# Patient Record
Sex: Male | Born: 1970
Health system: Southern US, Community
[De-identification: ages and names within clinical notes are randomized; demographics above are authoritative.]

## PROBLEM LIST (undated history)

## (undated) DIAGNOSIS — K219 Gastro-esophageal reflux disease without esophagitis: Secondary | ICD-10-CM

## (undated) DIAGNOSIS — J302 Other seasonal allergic rhinitis: Secondary | ICD-10-CM

## (undated) DIAGNOSIS — K5792 Diverticulitis of intestine, part unspecified, without perforation or abscess without bleeding: Secondary | ICD-10-CM

## (undated) DIAGNOSIS — R740 Nonspecific elevation of levels of transaminase and lactic acid dehydrogenase [LDH]: Secondary | ICD-10-CM

## (undated) DIAGNOSIS — T7840XA Allergy, unspecified, initial encounter: Secondary | ICD-10-CM

## (undated) DIAGNOSIS — R7401 Elevation of levels of liver transaminase levels: Secondary | ICD-10-CM

## (undated) DIAGNOSIS — E785 Hyperlipidemia, unspecified: Secondary | ICD-10-CM

## (undated) DIAGNOSIS — I700 Atherosclerosis of aorta: Secondary | ICD-10-CM

## (undated) DIAGNOSIS — R932 Abnormal findings on diagnostic imaging of liver and biliary tract: Secondary | ICD-10-CM

## (undated) HISTORY — DX: Nonspecific elevation of levels of transaminase and lactic acid dehydrogenase (ldh): R74.0

## (undated) HISTORY — DX: Elevation of levels of liver transaminase levels: R74.01

## (undated) HISTORY — PX: APPENDECTOMY: SHX54

## (undated) HISTORY — DX: Other seasonal allergic rhinitis: J30.2

## (undated) HISTORY — DX: Hyperlipidemia, unspecified: E78.5

## (undated) HISTORY — DX: Gastro-esophageal reflux disease without esophagitis: K21.9

## (undated) HISTORY — DX: Diverticulitis of intestine, part unspecified, without perforation or abscess without bleeding: K57.92

## (undated) HISTORY — DX: Abnormal findings on diagnostic imaging of liver and biliary tract: R93.2

## (undated) HISTORY — PX: INGUINAL HERNIA REPAIR: SUR1180

## (undated) HISTORY — DX: Atherosclerosis of aorta: I70.0

## (undated) HISTORY — DX: Allergy, unspecified, initial encounter: T78.40XA

---
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
## 2006-03-07 ENCOUNTER — Ambulatory Visit: Payer: Self-pay | Admitting: Family Medicine

## 2006-05-10 HISTORY — PX: INGUINAL HERNIA REPAIR: SUR1180

## 2006-06-01 ENCOUNTER — Ambulatory Visit: Payer: Self-pay | Admitting: Family Medicine

## 2006-12-08 ENCOUNTER — Ambulatory Visit: Payer: Self-pay | Admitting: Family Medicine

## 2007-02-07 ENCOUNTER — Encounter (INDEPENDENT_AMBULATORY_CARE_PROVIDER_SITE_OTHER): Payer: Self-pay | Admitting: General Surgery

## 2007-02-07 ENCOUNTER — Ambulatory Visit (HOSPITAL_BASED_OUTPATIENT_CLINIC_OR_DEPARTMENT_OTHER): Admission: RE | Admit: 2007-02-07 | Discharge: 2007-02-07 | Payer: Self-pay | Admitting: General Surgery

## 2007-06-06 ENCOUNTER — Ambulatory Visit: Payer: Self-pay | Admitting: Family Medicine

## 2007-07-25 ENCOUNTER — Ambulatory Visit: Payer: Self-pay | Admitting: Family Medicine

## 2008-09-25 ENCOUNTER — Emergency Department (HOSPITAL_COMMUNITY): Admission: EM | Admit: 2008-09-25 | Discharge: 2008-09-25 | Payer: Self-pay | Admitting: *Deleted

## 2008-09-25 IMAGING — CR DG CHEST 2V
2 series · 2 of 2 positions shown · non-contrast
Comparison: None

CLINICAL DATA: Left-sided chest pain.

CHEST - 2 VIEW

[w chest pa]
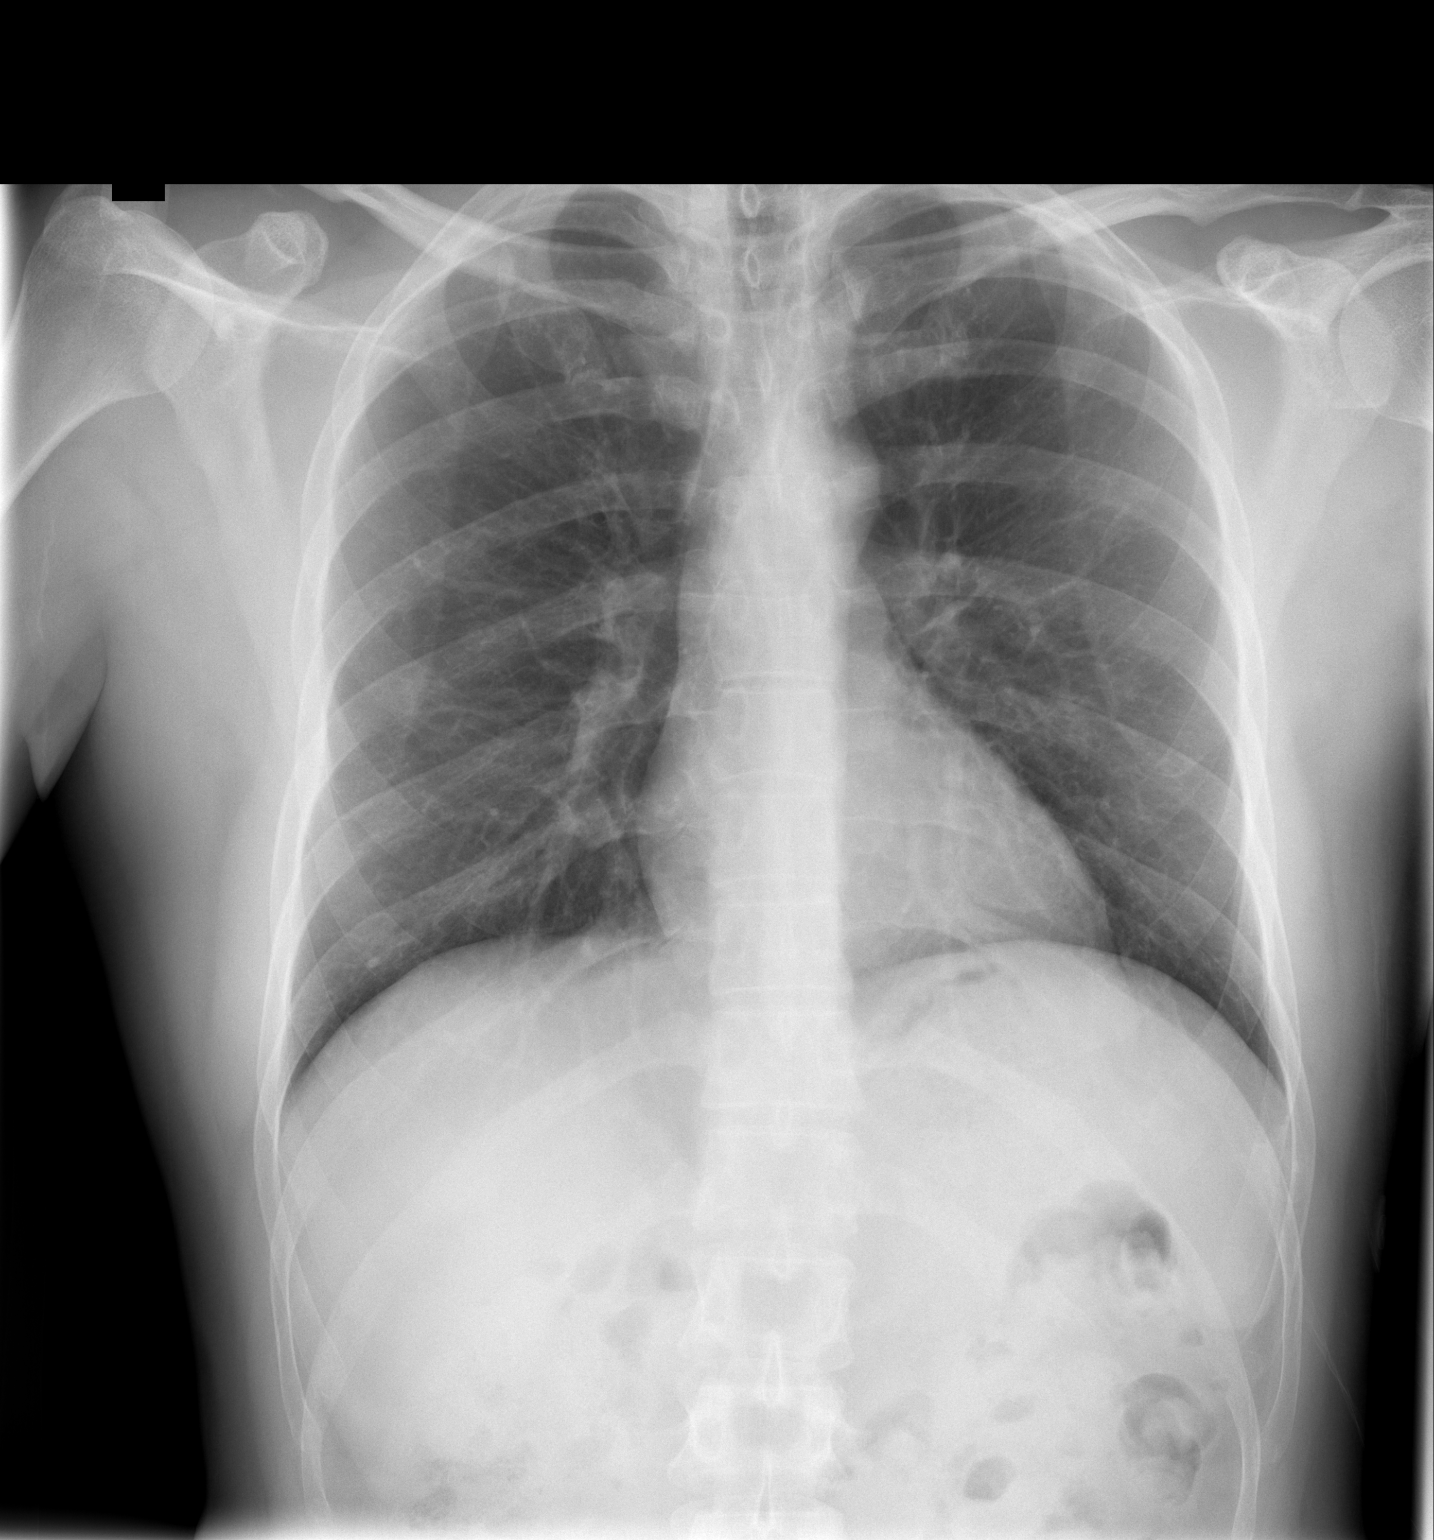

[w chest lat]
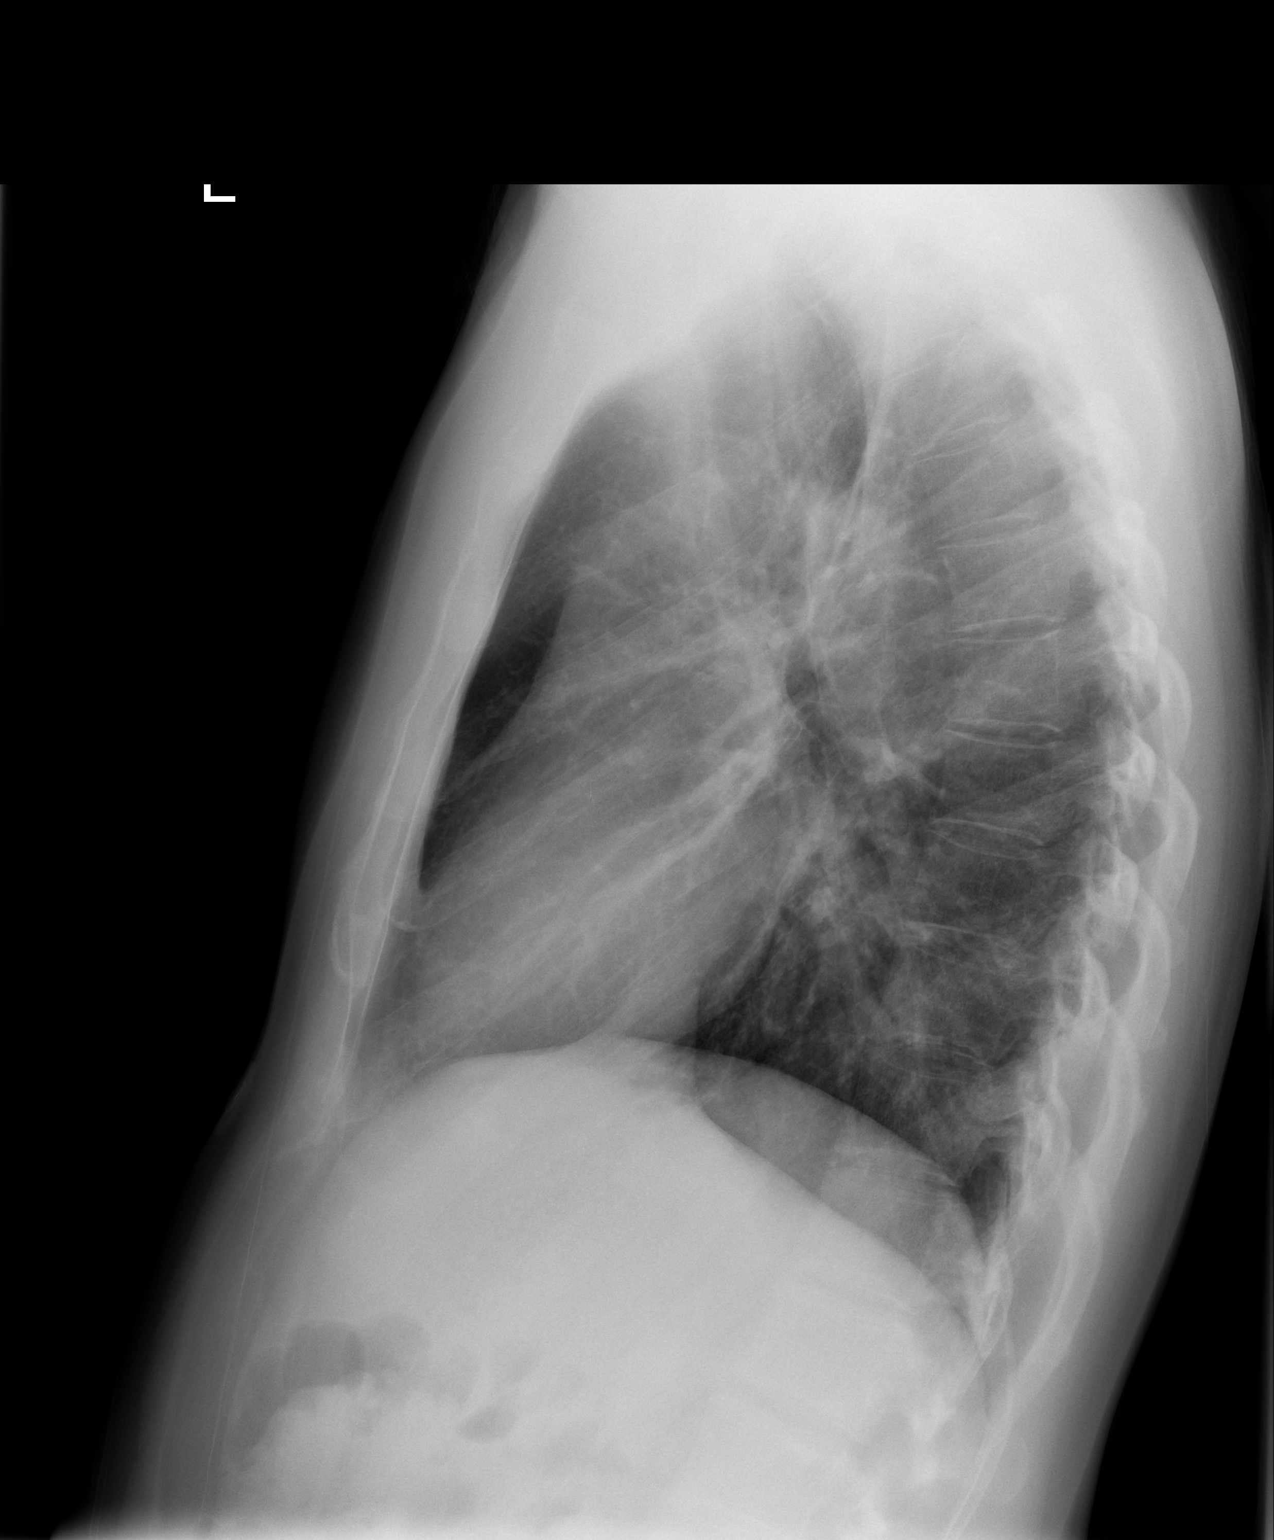

[2 of 2 positions shown; findings below may reference images not displayed]

FINDINGS: The heart size and mediastinal contours are within normal
limits.  Both lungs are clear.  The visualized skeletal structures
are unremarkable.
IMPRESSION: No active disease.

## 2008-09-26 ENCOUNTER — Ambulatory Visit: Payer: Self-pay | Admitting: Family Medicine

## 2008-10-17 ENCOUNTER — Ambulatory Visit: Payer: Self-pay | Admitting: Family Medicine

## 2009-01-15 ENCOUNTER — Ambulatory Visit: Payer: Self-pay | Admitting: Family Medicine

## 2009-01-29 ENCOUNTER — Ambulatory Visit: Payer: Self-pay | Admitting: Family Medicine

## 2009-10-21 ENCOUNTER — Ambulatory Visit: Payer: Self-pay | Admitting: Family Medicine

## 2010-08-18 LAB — CK TOTAL AND CKMB (NOT AT ARMC)
CK, MB: 1.8 ng/mL (ref 0.3–4.0)
Total CK: 107 U/L (ref 7–232)

## 2010-08-18 LAB — POCT CARDIAC MARKERS
CKMB, poc: <1 ng/mL — ABNORMAL LOW (ref 1.0–8.0)
Myoglobin, poc: 42.2 ng/mL (ref 12–200)
Troponin i, poc: <0.05 ng/mL (ref 0.00–0.09)
Troponin i, poc: <0.05 ng/mL (ref 0.00–0.09)

## 2010-08-18 LAB — CBC
MCHC: 34.8 g/dL (ref 30.0–36.0)
MCV: 88.3 fL (ref 78.0–100.0)
Platelets: 253 10*3/uL (ref 150–400)
WBC: 9.4 10*3/uL (ref 4.0–10.5)

## 2010-08-18 LAB — DIFFERENTIAL
Lymphocytes Relative: 27 % (ref 12–46)
Lymphs Abs: 2.5 10*3/uL (ref 0.7–4.0)
Neutro Abs: 5.7 10*3/uL (ref 1.7–7.7)
Neutrophils Relative %: 61 % (ref 43–77)

## 2010-08-18 LAB — COMPREHENSIVE METABOLIC PANEL
CO2: 30 mEq/L (ref 19–32)
Calcium: 9.2 mg/dL (ref 8.4–10.5)
Creatinine, Ser: 0.87 mg/dL (ref 0.4–1.5)
GFR calc Af Amer: >60 mL/min (ref >60)
GFR calc non Af Amer: >60 mL/min (ref >60)
Glucose, Bld: 97 mg/dL (ref 70–99)
Total Protein: 7 g/dL (ref 6.0–8.3)

## 2010-08-18 LAB — POCT I-STAT, CHEM 8
Calcium, Ion: 1.17 mmol/L (ref 1.12–1.32)
Chloride: 100 mEq/L (ref 96–112)
HCT: 46 % (ref 39.0–52.0)
Potassium: 4 mEq/L (ref 3.5–5.1)
Sodium: 138 mEq/L (ref 135–145)

## 2010-08-18 LAB — LIPASE, BLOOD: Lipase: 25 U/L (ref 11–59)

## 2010-08-18 LAB — TROPONIN I: Troponin I: 0.01 ng/mL (ref 0.00–0.06)

## 2010-08-21 ENCOUNTER — Ambulatory Visit (INDEPENDENT_AMBULATORY_CARE_PROVIDER_SITE_OTHER): Payer: 59 | Admitting: Family Medicine

## 2010-08-21 DIAGNOSIS — Z79899 Other long term (current) drug therapy: Secondary | ICD-10-CM

## 2010-08-21 DIAGNOSIS — J309 Allergic rhinitis, unspecified: Secondary | ICD-10-CM

## 2010-09-22 NOTE — Op Note (Signed)
NAMEADNAN, VANVOORHIS                ACCOUNT NO.:  [id number]   MEDICAL RECORD NO.:  [id number]          PATIENT TYPE:  AMB   LOCATION:  DSC                          FACILITY:  [facility]   PHYSICIAN:  Cherylynn Ridges, M.D.    DATE OF BIRTH:  Mar 24, 1971   DATE OF PROCEDURE:  02/07/2007  DATE OF DISCHARGE:                               OPERATIVE REPORT   PREOPERATIVE DIAGNOSIS:  Left inguinal hernia.   POSTOPERATIVE DIAGNOSIS:  Left direct inguinal hernia with lipoma of the  cord.   PROCEDURES:  1. Left inguinal hernia repair with mesh.  2. Resection of lipoma of the cord.   SURGEON:  Cherylynn Ridges, M.D.   ASSISTANT:  None.   ANESTHESIA:  General with a laryngeal airway.   ESTIMATED BLOOD LOSS:  Less than 20 mL.   COMPLICATIONS:  None.   CONDITION:  Stable.   SPECIMEN:  Lipoma of the cord.   INDICATIONS FOR OPERATION:  The patient is a 40 year old with a left  inguinal hernia noted on examination in the office and now comes in for  repair.   FINDINGS:  The patient had a small lipoma of the cord.  There was a  direct weakness of the floor, but no indirect sac.   OPERATION:  The patient was taken to the operating room and placed on  the table in the supine position.  After an adequate laryngeal airway  anesthetic was administered, he was prepped and draped in the usual  sterile manner, exposing the left inguinal area.   A transverse-curvilinear incision was made at the level of the  superficial ring and taken down to the subcutaneous tissue.  It was  subsequently taken down through the Scarpa fascia using electrocautery,  exposing the external oblique fascia.  We split the fascia along its  fibers through the superficial ring, then exposed the spermatic cord and  mobilized the use of the Penrose drain.   As we placed the spermatic cord up on a work bench, there was a lipoma  that was easily resected and removed from the cord, and ligated at its  base with a 3-0 Vicryl tie.   We inspected anterior and medial aspect of  the cord, and found no indirect sac.  As we retracted the cord out of  the way inferior and laterally, we explored the weakness of the direct  floor which we repaired using an oval patch of mesh measuring 3 x 5 cm  in size, and attaching it to the conjoin tendon and the reflective  portion of the inguinal ligament using running 0-Prolene suture.  The  mesh had been soaked in antibiotic solution.   We irrigated with antibiotic solution and once it was in place, we  closed the external oblique fascia on top of the cord, using running 3-0  Vicryl suture.  We injected 25 mL of 0.5% Marcaine without epinephrine  into the wound.  We closed the Scarpa fascia using interrupted 3-0  Vicryl sutures, and closed the skin using running subcuticular stitch of  4-0 Monocryl.  A sterile dressing was applied,  including Steri-Strips  and Dermabond.      Cherylynn Ridges, M.D.  Electronically Signed     JOW/MEDQ  D:  02/07/2007  T:  02/07/2007  Job:  [id number]   cc:   Sharlot Gowda, M.D.

## 2011-09-23 ENCOUNTER — Ambulatory Visit (INDEPENDENT_AMBULATORY_CARE_PROVIDER_SITE_OTHER): Payer: 59 | Admitting: Family Medicine

## 2011-09-23 ENCOUNTER — Encounter: Payer: Self-pay | Admitting: Family Medicine

## 2011-09-23 VITALS — BP 118/80 | HR 62 | Wt 166.0 lb

## 2011-09-23 DIAGNOSIS — R253 Fasciculation: Secondary | ICD-10-CM

## 2011-09-23 DIAGNOSIS — R259 Unspecified abnormal involuntary movements: Secondary | ICD-10-CM

## 2011-09-23 LAB — COMPREHENSIVE METABOLIC PANEL
ALT: 27 U/L (ref 0–53)
CO2: 26 mEq/L (ref 19–32)
Calcium: 9.1 mg/dL (ref 8.4–10.5)
Chloride: 101 mEq/L (ref 96–112)
Creat: 0.94 mg/dL (ref 0.50–1.35)
Sodium: 136 mEq/L (ref 135–145)
Total Protein: 7.1 g/dL (ref 6.0–8.3)

## 2011-09-23 NOTE — Progress Notes (Signed)
  Subjective:    Patient ID: Jon Cruz, male    DOB: 07/22/1970, 41 y.o.   MRN: [id number]  HPI This morning he noted muscle twitching in his left pectoralis muscle. No history of recent overuse or injury. He is noted no twitching in any other muscles. He has had no visual changes, weakness, numbness or tingling.   Review of Systems     Objective:   Physical Exam Alert and in no distress. Muscle fasciculations noted in the left pectoralis. They did not occur on the right or on observation in any other area. DTRs normal. Myotactic reflexes normal. No clonus noted. Normal strength is noted.       Assessment & Plan:   1. Fasciculations of muscle  CBC with Differential, Comprehensive metabolic panel   I discussed options with him. We will do basic blood work. If he continues with this and developed more fasciculations, further neurologic evaluation will be necessary.

## 2011-09-23 NOTE — Patient Instructions (Signed)
If the muscle twitching gets worse and involves other areas of her body, call me

## 2011-09-24 LAB — CBC WITH DIFFERENTIAL/PLATELET
Eosinophils Relative: 3 % (ref 0–5)
Lymphocytes Relative: 29 % (ref 12–46)
Lymphs Abs: 2.8 10*3/uL (ref 0.7–4.0)
MCV: 87.5 fL (ref 78.0–100.0)
Platelets: 273 10*3/uL (ref 150–400)
RBC: 5.22 MIL/uL (ref 4.22–5.81)
WBC: 9.8 10*3/uL (ref 4.0–10.5)

## 2011-09-24 NOTE — Progress Notes (Signed)
  Subjective:    Patient ID: Jon Cruz, male    DOB: [birth year], 41 y.o.   MRN: [id number]  HPI    Review of Systems     Objective:   Physical Exam        Assessment & Plan:  His blood work was normal. He is no longer having symptoms. I told him to keep in touch with me concerning these.

## 2011-10-01 ENCOUNTER — Encounter: Payer: Self-pay | Admitting: Family Medicine

## 2011-10-01 ENCOUNTER — Ambulatory Visit (INDEPENDENT_AMBULATORY_CARE_PROVIDER_SITE_OTHER): Payer: 59 | Admitting: Family Medicine

## 2011-10-01 VITALS — BP 122/86 | HR 84 | Temp 98.0°F | Ht 66.5 in | Wt 161.0 lb

## 2011-10-01 DIAGNOSIS — J309 Allergic rhinitis, unspecified: Secondary | ICD-10-CM

## 2011-10-01 DIAGNOSIS — J069 Acute upper respiratory infection, unspecified: Secondary | ICD-10-CM

## 2011-10-01 MED ORDER — AMOXICILLIN 875 MG PO TABS: 875.0000 mg | ORAL_TABLET | Freq: Two times a day (BID) | ORAL | Status: AC

## 2011-10-01 MED ORDER — MOMETASONE FUROATE 50 MCG/ACT NA SUSP: 2.0000 | Freq: Every day | NASAL | Status: DC

## 2011-10-01 NOTE — Patient Instructions (Signed)
Continue Zyrtec D and Nasonex Try sinus rinses once or twice daily to help with pressure in your cheeks Add Mucinex twice daily (tablets, or else follow direction on box)--plain Mucinex (DM only if you develop a lot of coughing.  Do NOT take Mucinex D along with Zyrtec D.  Start the antibiotics if symptoms are progressively getting worse over the next 3-5 days. If you start them, you must finish them.  Call if symptoms not improving after being on the antibiotics for 5-7 days.

## 2011-10-01 NOTE — Progress Notes (Signed)
Chief Complaint  Patient presents with  . Sinusitis    allergies, congestion, sore throat, drainage, pressure under the eyes, can't breath, eyes ringing,   HPI:  Patient has a history of allergies, which has been bothering him all season, at least a month or two.  3 days ago he started feeling worse--got tactile fever (sweaty/cold).  Fever lasted about 2 days, no fever yesterday, but yesterday started feeling worse. Developed pain in both cheeks, head feels completely clogged, can't breathe out of his nose.  L ear is ringing.  Hearing is fine, no pain.  Having some scratchy throat, and slight cough.  Nose slightly runny today. Nasal mucus is clear.  Phlegm that he coughs up is a little discolored (sometimes yellow, or greenish-brown, mostly in the mornings that has the darker discoloration).  Using Nasonex and zyrtec D daily, and using tylenol and aspirin recently.  Denies any sick contacts.  Past Medical History  Diagnosis Date  . Allergy    History   Social History  . Marital Status: Single    Spouse Name: N/A    Number of Children: N/A  . Years of Education: N/A   Occupational History  . Not on file.   Social History Main Topics  . Smoking status: Never Smoker   . Smokeless tobacco: Never Used  . Alcohol Use: Not on file  . Drug Use: Not on file  . Sexually Active: Not on file   Other Topics Concern  . Not on file   Social History Narrative  . No narrative on file    Current Outpatient Prescriptions on File Prior to Visit  Medication Sig Dispense Refill  . aspirin (BAYER ASPIRIN) 325 MG tablet Take 325 mg by mouth daily.      . cetirizine-pseudoephedrine (ZYRTEC-D) 5-120 MG per tablet Take 1 tablet by mouth 2 (two) times daily.      Marland Kitchen DISCONTD: mometasone (NASONEX) 50 MCG/ACT nasal spray Place 2 sprays into the nose daily.       No Known Allergies  ROS:  Denies nausea, vomiting, diarrhea, skin rashes.  +headache (sinus), sore throat, ear pain.  Some myalgias (worse  when had fever).  PHYSICAL EXAM: BP 122/86  Pulse 84  Temp(Src) 98 F (36.7 C) (Oral)  Ht 5' 6.5" (1.689 m)  Wt 161 lb (73.029 kg)  BMI 25.60 kg/m2 Well developed, well-appearing male in no distress HEENT:  PERRL, EOMI, conjunctiva clear. Nasal mucosa moderately edematous, clear drainage.  Mild tenderness over both maxillary sinuses.  TM's and EAC's normal.  OP clear Neck: no lymphadenopathy or mass Heart: regular rate and rhythm without murmur Lungs: clear bilaterally Skin: no rash Psych: normal mood, affect ,hygiene and grooming  ASSESSMENT/PLAN: 1. URI (upper respiratory infection)  amoxicillin (AMOXIL) 875 MG tablet  2. Allergic rhinitis, cause unspecified  mometasone (NASONEX) 50 MCG/ACT nasal spray   Allergies, with concurrent URI.  Doubt bacterial sinusitis at this point.  Treat with conservative measures, and start amoxacillin only if symptoms persist or worsen.  Sinus rinses Mucinex Continue Zyrtec-D and Nasonex--proper use reviewed

## 2012-01-04 ENCOUNTER — Ambulatory Visit (INDEPENDENT_AMBULATORY_CARE_PROVIDER_SITE_OTHER): Payer: 59 | Admitting: Family Medicine

## 2012-01-04 VITALS — BP 130/90 | Wt 168.0 lb

## 2012-01-04 DIAGNOSIS — J309 Allergic rhinitis, unspecified: Secondary | ICD-10-CM

## 2012-01-04 DIAGNOSIS — J029 Acute pharyngitis, unspecified: Secondary | ICD-10-CM

## 2012-01-04 LAB — POCT RAPID STREP A (OFFICE): Rapid Strep A Screen: NEGATIVE

## 2012-01-04 NOTE — Progress Notes (Signed)
  Subjective:    Patient ID: Jon Cruz, male    DOB: 1971/05/03, 41 y.o.   MRN: [id number]  HPI 3 weeks ago he started having difficulty with left-sided sore throat, ear pressure/ringing. No fever, chills, cough or congestion other than normal. He also did have left toothache; the dentist didn't find anything wrong. He does have a history of allergic rhinitis and is presently on Zyrtec as well as Nasonex.  Review of Systems     Objective:   Physical Exam alert and in no distress. Tympanic membranes and canals are normal. Throat is clear. Tonsils are normal. Neck is supple without adenopathy or thyromegaly. Cardiac exam shows a regular sinus rhythm without murmurs or gallops. Lungs are clear to auscultation. Strep screen negative       Assessment & Plan:   1. Sore throat  POCT rapid strep A  2. Allergic rhinitis, mild     his symptoms are probably more allergy related. Recommend he continue on his present medication regimen.

## 2013-01-29 ENCOUNTER — Telehealth: Payer: Self-pay | Admitting: Internal Medicine

## 2013-01-29 NOTE — Telephone Encounter (Signed)
There is none listed in epic ;check his old record. If he has not had one in 10 years have him schedule for it.

## 2013-01-29 NOTE — Telephone Encounter (Signed)
Pt states he just had a baby and was recommended he get a TDAP. Is this okay

## 2013-01-29 NOTE — Telephone Encounter (Signed)
PT LAST HAD TDAP IN 2004

## 2013-01-30 ENCOUNTER — Other Ambulatory Visit (INDEPENDENT_AMBULATORY_CARE_PROVIDER_SITE_OTHER): Payer: 59

## 2013-01-30 DIAGNOSIS — Z23 Encounter for immunization: Secondary | ICD-10-CM

## 2013-08-02 ENCOUNTER — Encounter: Payer: Self-pay | Admitting: Family Medicine

## 2013-08-02 ENCOUNTER — Ambulatory Visit (INDEPENDENT_AMBULATORY_CARE_PROVIDER_SITE_OTHER): Payer: 59 | Admitting: Family Medicine

## 2013-08-02 VITALS — BP 102/82 | HR 76 | Ht 68.0 in | Wt 167.0 lb

## 2013-08-02 DIAGNOSIS — Z0000 Encounter for general adult medical examination without abnormal findings: Secondary | ICD-10-CM

## 2013-08-02 DIAGNOSIS — J309 Allergic rhinitis, unspecified: Secondary | ICD-10-CM

## 2013-08-02 DIAGNOSIS — K219 Gastro-esophageal reflux disease without esophagitis: Secondary | ICD-10-CM

## 2013-08-02 LAB — CBC WITH DIFFERENTIAL/PLATELET
BASOS ABS: 0 10*3/uL (ref 0.0–0.1)
Basophils Relative: 0 % (ref 0–1)
EOS PCT: 5 % (ref 0–5)
Eosinophils Absolute: 0.5 10*3/uL (ref 0.0–0.7)
HEMATOCRIT: 45.3 % (ref 39.0–52.0)
HEMOGLOBIN: 15.7 g/dL (ref 13.0–17.0)
LYMPHS ABS: 2.4 10*3/uL (ref 0.7–4.0)
LYMPHS PCT: 24 % (ref 12–46)
MCH: 28.9 pg (ref 26.0–34.0)
MCHC: 34.7 g/dL (ref 30.0–36.0)
MCV: 83.4 fL (ref 78.0–100.0)
MONO ABS: 0.7 10*3/uL (ref 0.1–1.0)
Monocytes Relative: 7 % (ref 3–12)
NEUTROS ABS: 6.5 10*3/uL (ref 1.7–7.7)
Neutrophils Relative %: 64 % (ref 43–77)
Platelets: 288 10*3/uL (ref 150–400)
RBC: 5.43 MIL/uL (ref 4.22–5.81)
RDW: 13.7 % (ref 11.5–15.5)
WBC: 10.1 10*3/uL (ref 4.0–10.5)

## 2013-08-02 LAB — POCT URINALYSIS DIPSTICK
BILIRUBIN UA: NEGATIVE
Blood, UA: NEGATIVE
GLUCOSE UA: NEGATIVE
KETONES UA: NEGATIVE
LEUKOCYTES UA: NEGATIVE
Nitrite, UA: NEGATIVE
Protein, UA: NEGATIVE
Urobilinogen, UA: NEGATIVE
pH, UA: 7

## 2013-08-02 LAB — HEMOCCULT GUIAC POC 1CARD (OFFICE)

## 2013-08-02 LAB — LIPID PANEL
CHOLESTEROL: 176 mg/dL (ref 0–200)
HDL: 39 mg/dL — AB (ref >39)
LDL Cholesterol: 104 mg/dL — ABNORMAL HIGH (ref 0–99)
Total CHOL/HDL Ratio: 4.5 Ratio
Triglycerides: 165 mg/dL — ABNORMAL HIGH (ref <150)
VLDL: 33 mg/dL (ref 0–40)

## 2013-08-02 LAB — COMPREHENSIVE METABOLIC PANEL
ALBUMIN: 4.5 g/dL (ref 3.5–5.2)
ALT: 35 U/L (ref 0–53)
AST: 24 U/L (ref 0–37)
Alkaline Phosphatase: 91 U/L (ref 39–117)
BUN: 13 mg/dL (ref 6–23)
CALCIUM: 9.1 mg/dL (ref 8.4–10.5)
CHLORIDE: 101 meq/L (ref 96–112)
CO2: 26 meq/L (ref 19–32)
Creat: 1 mg/dL (ref 0.50–1.35)
Glucose, Bld: 80 mg/dL (ref 70–99)
POTASSIUM: 4 meq/L (ref 3.5–5.3)
Sodium: 136 mEq/L (ref 135–145)
Total Bilirubin: 0.4 mg/dL (ref 0.2–1.2)
Total Protein: 7.3 g/dL (ref 6.0–8.3)

## 2013-08-02 NOTE — Progress Notes (Signed)
   Subjective:    Patient ID: Jon Cruz, male    DOB: [birth year], 43 y.o.   MRN: [id number]  HPI He is here for complete examination. He does have underlying allergies and he takes Nasacort and Zyrtec as needed. He also has reflux disease and uses Prilosec on an as-needed basis. He has no other concerns or complaints. He does not smoke and rarely drinks. He is now the father of 31-month-old twins. Work is Cruz well. His exercise has been minimal due to twins.   Review of Systems  All other systems reviewed and are negative.       Objective:   Physical Exam BP 102/82  Pulse 76  Ht 5\' 8"  (1.727 m)  Wt 167 lb (75.751 kg)  BMI 25.40 kg/m2  General Appearance:    Alert, cooperative, no distress, appears stated age  Head:    Normocephalic, without obvious abnormality, atraumatic  Eyes:    PERRL, conjunctiva/corneas clear, EOM's intact, fundi    benign  Ears:    Normal TM's and external ear canals  Nose:   Nares normal, mucosa normal, no drainage or sinus   tenderness  Throat:   Lips, mucosa, and tongue normal; teeth and gums normal  Neck:   Supple, no lymphadenopathy;  thyroid:  no   enlargement/tenderness/nodules; no carotid   bruit or JVD  Back:    Spine nontender, no curvature, ROM normal, no CVA     tenderness  Lungs:     Clear to auscultation bilaterally without wheezes, rales or     ronchi; respirations unlabored  Chest Wall:    No tenderness or deformity   Heart:    Regular rate and rhythm, S1 and S2 normal, no murmur, rub   or gallop  Breast Exam:    No chest wall tenderness, masses or gynecomastia  Abdomen:     Soft, non-tender, nondistended, normoactive bowel sounds,    no masses, no hepatosplenomegaly  Genitalia:    Normal male external genitalia without lesions.  Testicles without masses.  No inguinal hernias.  Rectal:    Normal sphincter tone, no masses or tenderness; guaiac negative stool.  Prostate smooth, no nodules, not enlarged.  Extremities:   No clubbing,  cyanosis or edema  Pulses:   2+ and symmetric all extremities  Skin:   Skin color, texture, turgor normal, no rashes or lesions  Lymph nodes:   Cervical, supraclavicular, and axillary nodes normal  Neurologic:   CNII-XII intact, normal strength, sensation and gait; reflexes 2+ and symmetric throughout          Psych:   Normal mood, affect, hygiene and grooming.          Assessment & Plan:  Routine general medical examination at a health care facility - Plan: Urinalysis Dipstick  Allergic rhinitis, mild  GERD (gastroesophageal reflux disease)  I encouraged him to continue to take good care of himself and get more physically active.

## 2015-10-14 ENCOUNTER — Ambulatory Visit (INDEPENDENT_AMBULATORY_CARE_PROVIDER_SITE_OTHER): Payer: 59 | Admitting: Family Medicine

## 2015-10-14 ENCOUNTER — Encounter: Payer: Self-pay | Admitting: Family Medicine

## 2015-10-14 VITALS — BP 120/80 | HR 93 | Wt 164.0 lb

## 2015-10-14 DIAGNOSIS — J0100 Acute maxillary sinusitis, unspecified: Secondary | ICD-10-CM

## 2015-10-14 MED ORDER — AMOXICILLIN 875 MG PO TABS: 875.0000 mg | ORAL_TABLET | Freq: Two times a day (BID) | ORAL | Status: DC

## 2015-10-14 NOTE — Progress Notes (Signed)
Subjective:     Patient ID: Jon Cruz, male   DOB: 1970-11-17, 45 y.o.   MRN: [id number]  HPI Jon Cruz comes to clinic with a 7 day history of nasal congestion, productive cough, sinus pressure, and sore throat that has worsened to include facial pain 3 days ago and right ear pressure and tinnitus yesterday.  He denies fever, rhinorrhea, watery eyes, shortness of breath, changes in vision, nausea, and vomiting.  He has a history of seasonal allergies for which he takes Zyrtec-D, no underlying lung conditions, and is a non-smoker.  He has had some relief from his headache with tylenol and aspirin use.   Review of Systems     Objective:   Physical Exam Alert and in no distress.  Tympanic membranes and canals are normal. Nasal mucosa is slightly red Pharyngeal area is normal. Neck is supple without adenopathy or thyromegaly.  Frontal and maxillary sinus tenderness Cardiac exam shows a regular sinus rhythm without murmurs or gallops.  Lungs are clear to auscultation bilaterally.     Assessment / Plan:    Acute maxillary sinusitis, recurrence not specified - Plan: amoxicillin (AMOXIL) 875 MG tablet   Will start with a 10 day course of amoxicillin for possible bacterial sinus infection and continue tylenol or aspirin for headache relief. He will follow up if symptoms are not completely gone at the end of treatment.     History and physical exam conducted by Marylen Ponto (Medical Student) in conjunction with Dr. Redmond School.

## 2016-06-15 DIAGNOSIS — Z23 Encounter for immunization: Secondary | ICD-10-CM | POA: Diagnosis not present

## 2016-06-28 DIAGNOSIS — H00022 Hordeolum internum right lower eyelid: Secondary | ICD-10-CM | POA: Diagnosis not present

## 2016-07-01 DIAGNOSIS — H00022 Hordeolum internum right lower eyelid: Secondary | ICD-10-CM | POA: Diagnosis not present

## 2016-11-11 DIAGNOSIS — H00022 Hordeolum internum right lower eyelid: Secondary | ICD-10-CM | POA: Diagnosis not present

## 2017-01-18 DIAGNOSIS — H00024 Hordeolum internum left upper eyelid: Secondary | ICD-10-CM | POA: Diagnosis not present

## 2017-02-17 ENCOUNTER — Encounter: Payer: Self-pay | Admitting: Family Medicine

## 2017-02-17 ENCOUNTER — Ambulatory Visit
Admission: RE | Admit: 2017-02-17 | Discharge: 2017-02-17 | Disposition: A | Discharge status: Home / Self Care | Payer: 59 | Source: Ambulatory Visit | Attending: Family Medicine | Admitting: Family Medicine

## 2017-02-17 ENCOUNTER — Ambulatory Visit (INDEPENDENT_AMBULATORY_CARE_PROVIDER_SITE_OTHER): Payer: 59 | Admitting: Family Medicine

## 2017-02-17 VITALS — BP 112/78 | HR 73 | Temp 98.3°F | Wt 167.2 lb

## 2017-02-17 DIAGNOSIS — R740 Nonspecific elevation of levels of transaminase and lactic acid dehydrogenase [LDH]: Secondary | ICD-10-CM

## 2017-02-17 DIAGNOSIS — R198 Other specified symptoms and signs involving the digestive system and abdomen: Secondary | ICD-10-CM

## 2017-02-17 DIAGNOSIS — Z9889 Other specified postprocedural states: Secondary | ICD-10-CM

## 2017-02-17 DIAGNOSIS — Z8719 Personal history of other diseases of the digestive system: Secondary | ICD-10-CM | POA: Diagnosis not present

## 2017-02-17 DIAGNOSIS — R10824 Left lower quadrant rebound abdominal tenderness: Secondary | ICD-10-CM

## 2017-02-17 DIAGNOSIS — R1032 Left lower quadrant pain: Secondary | ICD-10-CM | POA: Diagnosis not present

## 2017-02-17 DIAGNOSIS — K625 Hemorrhage of anus and rectum: Secondary | ICD-10-CM

## 2017-02-17 DIAGNOSIS — K5792 Diverticulitis of intestine, part unspecified, without perforation or abscess without bleeding: Secondary | ICD-10-CM

## 2017-02-17 DIAGNOSIS — R7401 Elevation of levels of liver transaminase levels: Secondary | ICD-10-CM | POA: Insufficient documentation

## 2017-02-17 LAB — POCT URINALYSIS DIP (PROADVANTAGE DEVICE)
BILIRUBIN UA: NEGATIVE
BILIRUBIN UA: NEGATIVE mg/dL
Blood, UA: NEGATIVE
Glucose, UA: NEGATIVE mg/dL
LEUKOCYTES UA: NEGATIVE
Nitrite, UA: NEGATIVE
PH UA: 6 (ref 5.0–8.0)
Protein Ur, POC: NEGATIVE mg/dL
Specific Gravity, Urine: 1.02
Urobilinogen, Ur: NEGATIVE

## 2017-02-17 LAB — COMPREHENSIVE METABOLIC PANEL
AG Ratio: 1.4 (calc) (ref 1.0–2.5)
ALBUMIN MSPROF: 4.2 g/dL (ref 3.6–5.1)
ALT: 58 U/L — ABNORMAL HIGH (ref 9–46)
AST: 31 U/L (ref 10–40)
Alkaline phosphatase (APISO): 100 U/L (ref 40–115)
BILIRUBIN TOTAL: 0.4 mg/dL (ref 0.2–1.2)
BUN: 11 mg/dL (ref 7–25)
CALCIUM: 8.6 mg/dL (ref 8.6–10.3)
CO2: 29 mmol/L (ref 20–32)
Chloride: 102 mmol/L (ref 98–110)
Creat: 0.96 mg/dL (ref 0.60–1.35)
GLOBULIN: 3.1 g/dL (ref 1.9–3.7)
Glucose, Bld: 102 mg/dL — ABNORMAL HIGH (ref 65–99)
POTASSIUM: 3.7 mmol/L (ref 3.5–5.3)
SODIUM: 137 mmol/L (ref 135–146)
TOTAL PROTEIN: 7.3 g/dL (ref 6.1–8.1)

## 2017-02-17 LAB — CBC WITH DIFFERENTIAL/PLATELET
BASOS PCT: 0.4 %
Basophils Absolute: 72 cells/uL (ref 0–200)
EOS PCT: 1.9 %
Eosinophils Absolute: 342 cells/uL (ref 15–500)
HEMATOCRIT: 43.2 % (ref 38.5–50.0)
HEMOGLOBIN: 15 g/dL (ref 13.2–17.1)
LYMPHS ABS: 2304 {cells}/uL (ref 850–3900)
MCH: 29.7 pg (ref 27.0–33.0)
MCHC: 34.7 g/dL (ref 32.0–36.0)
MCV: 85.5 fL (ref 80.0–100.0)
MPV: 10.2 fL (ref 7.5–12.5)
Monocytes Relative: 9.1 %
NEUTROS ABS: 13644 {cells}/uL — AB (ref 1500–7800)
Neutrophils Relative %: 75.8 %
Platelets: 285 10*3/uL (ref 140–400)
RBC: 5.05 10*6/uL (ref 4.20–5.80)
RDW: 12.9 % (ref 11.0–15.0)
Total Lymphocyte: 12.8 %
WBC mixed population: 1638 cells/uL — ABNORMAL HIGH (ref 200–950)
WBC: 18 10*3/uL — AB (ref 3.8–10.8)

## 2017-02-17 MED ORDER — METRONIDAZOLE 500 MG PO TABS: 500.0000 mg | ORAL_TABLET | Freq: Three times a day (TID) | ORAL | 0 refills | Status: DC

## 2017-02-17 MED ORDER — CIPROFLOXACIN HCL 500 MG PO TABS: 500.0000 mg | ORAL_TABLET | Freq: Two times a day (BID) | ORAL | 0 refills | Status: DC

## 2017-02-17 MED ORDER — IOPAMIDOL (ISOVUE-300) INJECTION 61%
100.0000 mL | Freq: Once | INTRAVENOUS | Status: AC | PRN
  Administered 2017-02-17: 100 mL via INTRAVENOUS

## 2017-02-17 NOTE — Progress Notes (Signed)
Subjective:    Patient ID: Jon Cruz, male    DOB: 1970/09/21, 46 y.o.   MRN: [id number]  HPI Chief Complaint  Patient presents with  . stomach sore    stomach sore, cramping, had hernia about 11 years ago, gas pressure, had BM this morning and thinks he might have seen some blood   He is here with complaints of LLQ pain after lifting heavy luggage 4 days ago. States his pain was initially a sore feeling and then he developed more severe pain over the past 24 hours in his LLQ. Pain is non radiating and intermittent. Movement sometimes makes the pain worse.   Complains of urinary frequency 5 days ago but he was drinking a lot water and this resolved.   States he had 3 bowel movements last night. Stools were somewhat "loose" with increased flatulence.  Stats this morning he had some bright red blood in his stool.   Denies fever, chills, dizziness, chest pain, palpitations, shortness of breath, nausea or vomiting.    He has never had a colonoscopy. History of reflux. Has not seen a GI in the past.   No personal history of family history of IBD or colon cancer.   States he has a history of hernia repair to left inguinal area 11-12 years ago. States a mesh was inserted.   Reviewed allergies, medications, past medical, surgical, family, and social history.    Review of Systems Pertinent positives and negatives in the history of present illness.     Objective:   Physical Exam  Constitutional: He is oriented to person, place, and time. He appears well-developed and well-nourished. He has a sickly appearance. No distress.  HENT:  Mouth/Throat: Uvula is midline, oropharynx is clear and moist and mucous membranes are normal.  Eyes: Pupils are equal, round, and reactive to light. Conjunctivae are normal.  Neck: Normal range of motion. Neck supple.  Cardiovascular: Normal rate, regular rhythm, normal heart sounds and normal pulses.   Pulmonary/Chest: Effort normal and breath sounds  normal.  Abdominal: Bowel sounds are decreased. There is no hepatosplenomegaly. There is tenderness in the left lower quadrant. There is rebound and guarding. There is no CVA tenderness. Hernia confirmed negative in the right inguinal area and confirmed negative in the left inguinal area.  Genitourinary: Testes normal and penis normal.  Genitourinary Comments: Unable to perform rectal exam due to patient's discomfort. Normal tone.   Neurological: He is alert and oriented to person, place, and time. No cranial nerve deficit or sensory deficit. Gait normal.  Skin: Skin is warm. No rash noted. He is diaphoretic. There is pallor.  Psychiatric: He has a normal mood and affect. His speech is normal and behavior is normal. Thought content normal. Cognition and memory are normal.   BP 112/78   Pulse 73   Temp 98.3 F (36.8 C) (Oral)   Wt 167 lb 3.2 oz (75.8 kg)   SpO2 99%   BMI 25.42 kg/m       Assessment & Plan:  Left lower quadrant abdominal tenderness with rebound tenderness - Plan: CBC with Differential/Platelet, Comprehensive metabolic panel, CT Abdomen Pelvis W Contrast, POCT Urinalysis DIP (Proadvantage Device)  Bright red blood per rectum - Plan: CBC with Differential/Platelet, Comprehensive metabolic panel, CT Abdomen Pelvis W Contrast  Abdominal guarding - Plan: CBC with Differential/Platelet, Comprehensive metabolic panel, CT Abdomen Pelvis W Contrast  Left lower quadrant pain - Plan: CT Abdomen Pelvis W Contrast, POCT Urinalysis DIP (Proadvantage Device)  History  of left inguinal hernia repair  Diverticulitis - Plan: ciprofloxacin (CIPRO) 500 MG tablet, metroNIDAZOLE (FLAGYL) 500 MG tablet  UA negative.  Gross bright red blood in stool while in the office. Small amount.   He is hemodynamically stable. Pain is under control.  Stat CBC, CMP and CT w contrast ordered.  Suspect diverticulitis vs colitis.

## 2017-02-21 ENCOUNTER — Ambulatory Visit (INDEPENDENT_AMBULATORY_CARE_PROVIDER_SITE_OTHER): Payer: 59 | Admitting: Family Medicine

## 2017-02-21 ENCOUNTER — Encounter: Payer: Self-pay | Admitting: Gastroenterology

## 2017-02-21 ENCOUNTER — Encounter: Payer: Self-pay | Admitting: Family Medicine

## 2017-02-21 VITALS — BP 120/78 | HR 70 | Temp 97.5°F

## 2017-02-21 DIAGNOSIS — I700 Atherosclerosis of aorta: Secondary | ICD-10-CM | POA: Diagnosis not present

## 2017-02-21 DIAGNOSIS — R932 Abnormal findings on diagnostic imaging of liver and biliary tract: Secondary | ICD-10-CM

## 2017-02-21 DIAGNOSIS — K5792 Diverticulitis of intestine, part unspecified, without perforation or abscess without bleeding: Secondary | ICD-10-CM

## 2017-02-21 HISTORY — DX: Abnormal findings on diagnostic imaging of liver and biliary tract: R93.2

## 2017-02-21 NOTE — Progress Notes (Signed)
   Subjective:    Patient ID: Jon Cruz, male    DOB: 1970/11/30, 46 y.o.   MRN: [id number]  HPI Chief Complaint  Patient presents with  . follow-up    follow-up on diverticulosis. feeling better   He is here to follow up on diverticulitis. Feeling much better since Thursday evening, at least 80% improved. No longer having blood in stools or abdominal cramping. He is still having some loose stools. Started eating Activia.  No fever, chills, abdominal pain, N/V.  Taking Cipro and Flagyl without any issues.   He would like to be referred to [facility] at [facility] for further evaluation.    CT showed that he has aortic atherosclerosis as an incidental finding as well as multiple liver lesions that are very small and most likely cysts.   Reviewed allergies, medications, past medical, surgical, and social history.   Review of Systems Pertinent positives and negatives in the history of present illness.     Objective:   Physical Exam BP 120/78   Pulse 70   Temp (!) 97.5 F (36.4 C) (Oral)  Alert and oriented and in no acute distress. Skin warm and dry, no pallor.      Assessment & Plan:  Diverticulitis - Plan: Ambulatory referral to Gastroenterology  Aortic atherosclerosis ([place])  Abnormal findings on diagnostic imaging of liver - Plan: Ambulatory referral to Gastroenterology  He appears to be doing much better and no difficulty taking antibiotics. Reviewed lab and CT results.  Referral made to GI. He also has multiple small lesions in his liver that were found on CT.  Discussed incidental finding of aortic atherosclerosis and that this is a risk of heart disease. Recommend that he watch his diet and exercise.

## 2017-04-13 ENCOUNTER — Encounter (INDEPENDENT_AMBULATORY_CARE_PROVIDER_SITE_OTHER): Payer: Self-pay

## 2017-04-13 ENCOUNTER — Ambulatory Visit: Payer: 59 | Admitting: Gastroenterology

## 2017-04-13 ENCOUNTER — Other Ambulatory Visit (INDEPENDENT_AMBULATORY_CARE_PROVIDER_SITE_OTHER): Payer: 59

## 2017-04-13 ENCOUNTER — Encounter: Payer: Self-pay | Admitting: Gastroenterology

## 2017-04-13 VITALS — BP 124/80 | HR 84 | Ht 67.5 in | Wt 165.4 lb

## 2017-04-13 DIAGNOSIS — Z800 Family history of malignant neoplasm of digestive organs: Secondary | ICD-10-CM | POA: Insufficient documentation

## 2017-04-13 DIAGNOSIS — Z8719 Personal history of other diseases of the digestive system: Secondary | ICD-10-CM

## 2017-04-13 DIAGNOSIS — R945 Abnormal results of liver function studies: Secondary | ICD-10-CM | POA: Diagnosis not present

## 2017-04-13 DIAGNOSIS — R140 Abdominal distension (gaseous): Secondary | ICD-10-CM

## 2017-04-13 DIAGNOSIS — R7989 Other specified abnormal findings of blood chemistry: Secondary | ICD-10-CM

## 2017-04-13 LAB — COMPREHENSIVE METABOLIC PANEL
ALK PHOS: 80 U/L (ref 39–117)
ALT: 46 U/L (ref 0–53)
AST: 28 U/L (ref 0–37)
Albumin: 4.4 g/dL (ref 3.5–5.2)
BUN: 14 mg/dL (ref 6–23)
CHLORIDE: 102 meq/L (ref 96–112)
CO2: 30 mEq/L (ref 19–32)
Calcium: 8.6 mg/dL (ref 8.4–10.5)
Creatinine, Ser: 1.03 mg/dL (ref 0.40–1.50)
GFR: 82.5 mL/min (ref >60.00)
GLUCOSE: 77 mg/dL (ref 70–99)
POTASSIUM: 4 meq/L (ref 3.5–5.1)
Sodium: 139 mEq/L (ref 135–145)
TOTAL PROTEIN: 7.4 g/dL (ref 6.0–8.3)
Total Bilirubin: 0.4 mg/dL (ref 0.2–1.2)

## 2017-04-13 MED ORDER — NA SULFATE-K SULFATE-MG SULF 17.5-3.13-1.6 GM/177ML PO SOLN: ORAL | 0 refills | Status: DC

## 2017-04-13 NOTE — Patient Instructions (Signed)
You have been scheduled for a colonoscopy. Please follow written instructions given to you at your visit today.  Please pick up your prep supplies at the pharmacy within the next 1-3 days. If you use inhalers (even only as needed), please bring them with you on the day of your procedure.   Go to the basement for labs today  Take Benefiber daily 1 tablespoon 2-3 times daily with meals  Use VSL #3 probiotic 112 Units daily x 4 weeks  Increase fluid intake to 8-10 cups daily   If you are age 27 or older, your body mass index should be between 23-30. Your Body mass index is 25.52 kg/m. If this is out of the aforementioned range listed, please consider follow up with your Primary Care Provider.  If you are age 29 or younger, your body mass index should be between 19-25. Your Body mass index is 25.52 kg/m. If this is out of the aformentioned range listed, please consider follow up with your Primary Care Provider.

## 2017-04-13 NOTE — Progress Notes (Signed)
Jon Cruz    [id number]    1971-03-07  Primary Care Physician:Lalonde, Elyse Jarvis, MD  Referring Physician: Girtha Rm, NP-C [place], [place] [address]  Chief complaint: History of diverticulitis, abnormal LFT  HPI: 46 year old male is here for evaluation after recent episode of acute diverticulitis descending colon in October 2018.  He presented to his PMD office with acute left lower quadrant abdominal pain, noted to have elevated WBC count of 18k and also LFT abnormality with elevated AST 31 and ALT 58 on February 17, 2017, compared to prior labs on August 02, 2013 was AST 24 ALT 35.  Bilirubin normal at 0.4 and alk phos 100. CT abdomen pelvis showed evidence of acute diverticulitis descending colon with no abscess or perforation. He was treated with oral antibiotics for 10 days with improvement of symptoms. He has intermittent bloating and occasional lower abdominal cramps.  Denies any nausea, vomiting, abdominal pain, melena or bright red blood per rectum  Family history of colon cancer in 2 nd degree relatives, maternal aunt and 1st cousin in their 67's.  Both mother and father had multiple colon polyps removed.  Outpatient Encounter Medications as of 04/13/2017  Medication Sig  . cetirizine-pseudoephedrine (ZYRTEC-D) 5-120 MG per tablet Take 1 tablet by mouth 2 (two) times daily.  . [DISCONTINUED] ciprofloxacin (CIPRO) 500 MG tablet Take 1 tablet (500 mg total) by mouth 2 (two) times daily.  . [DISCONTINUED] metroNIDAZOLE (FLAGYL) 500 MG tablet Take 1 tablet (500 mg total) by mouth 3 (three) times daily.   No facility-administered encounter medications on file as of 04/13/2017.     Allergies as of 04/13/2017  . (No Known Allergies)    Past Medical History:  Diagnosis Date  . Abnormal findings on diagnostic imaging of liver 02/21/2017   Incidental finding on CT- multiple subcentimeter low attenuation lesions, likely cysts per radiologist  .  Allergy   . Aortic atherosclerosis ([place])    on CT  . Diverticulitis   . Elevated ALT measurement     Past Surgical History:  Procedure Laterality Date  . INGUINAL HERNIA REPAIR      No family history on file.  Social History   Socioeconomic History  . Marital status: Married    Spouse name: Not on file  . Number of children: 2  . Years of education: Not on file  . Highest education level: Not on file  Social Needs  . Financial resource strain: Not on file  . Food insecurity - worry: Not on file  . Food insecurity - inability: Not on file  . Transportation needs - medical: Not on file  . Transportation needs - non-medical: Not on file  Occupational History  . Not on file  Tobacco Use  . Smoking status: Never Smoker  . Smokeless tobacco: Never Used  Substance and Sexual Activity  . Alcohol use: Yes    Comment: rare  . Drug use: No  . Sexual activity: Yes  Other Topics Concern  . Not on file  Social History Narrative  . Not on file      Review of systems: Review of Systems  Constitutional: Negative for fever and chills.  HENT: Positive for sinus problems  Eyes: Negative for blurred vision.  Respiratory: Negative for cough, shortness of breath and wheezing.   Cardiovascular: Negative for chest pain and palpitations.  Gastrointestinal: as per HPI Genitourinary: Negative for dysuria, urgency, frequency and hematuria.  Musculoskeletal:  Negative for myalgias, back pain and joint pain.  Skin: Negative for itching and rash.  Neurological: Negative for dizziness, tremors, focal weakness, seizures and loss of consciousness.  Endo/Heme/Allergies: Positive for seasonal allergies.  Psychiatric/Behavioral: Negative for depression, suicidal ideas and hallucinations.  All other systems reviewed and are negative.   Physical Exam: Vitals:   04/13/17 0911  BP: 124/80  Pulse: 84   Body mass index is 25.52 kg/m. Gen:      No acute distress HEENT:  EOMI, sclera  anicteric Neck:     No masses; no thyromegaly Lungs:    Clear to auscultation bilaterally; normal respiratory effort CV:         Regular rate and rhythm; no murmurs Abd:      + bowel sounds; soft, non-tender; no palpable masses, no distension Ext:    No edema; adequate peripheral perfusion Skin:      Warm and dry; no rash Neuro: alert and oriented x 3 Psych: normal mood and affect  Data Reviewed:  Reviewed labs, radiology imaging, old records and pertinent past GI work up  CT abdomen and pelvis with contrast 02/17/17 Lower chest: Unremarkable.  Hepatobiliary: Multiple subcentimeter low-attenuation lesions scattered throughout the liver, too small to characterize, but statistically likely to represent small cysts. No other larger more suspicious appearing hepatic lesions are noted. No intra or extrahepatic biliary ductal dilatation. Gallbladder is normal in appearance.  Pancreas: No pancreatic mass. No pancreatic ductal dilatation. No pancreatic or peripancreatic fluid or inflammatory changes.  Spleen: Unremarkable.  Adrenals/Urinary Tract: Bilateral kidneys and bilateral adrenal glands are normal in appearance. No hydroureteronephrosis. Urinary bladder is normal in appearance.  Stomach/Bowel: Normal appearance of the stomach. No pathologic dilatation of small bowel or colon. Several colonic diverticulae are noted. In the region of the distal descending colon there is extensive inflammation associated with a diverticulum, where there is also focal mural thickening of the colon and extensive surrounding inflammatory changes, indicative of an acute diverticulitis. This is best appreciated on axial image 58 of series 2. No discrete diverticular abscess is confidently identified at this time. No definite findings to suggest frank perforation. The appendix is not confidently identified and may be surgically absent. Regardless, there are no inflammatory changes noted adjacent  to the cecum to suggest the presence of an acute appendicitis at this time.  Vascular/Lymphatic: Aortic atherosclerosis, without evidence of aneurysm or dissection in the abdominal or pelvic vasculature. No lymphadenopathy noted in the abdomen or pelvis.  Reproductive: Prostate gland and seminal vesicles are unremarkable in appearance.  Other: Trace amount of fluid in the Ing left pericolic gutter adjacent to the area of acute diverticulitis. No other significant volume of ascites. No pneumoperitoneum.  Musculoskeletal: There are no aggressive appearing lytic or blastic lesions noted in the visualized portions of the skeleton.  IMPRESSION: 1. Acute diverticulitis at the transition in the distal descending colon. No diverticular abscess or signs of frank perforation is noted at this time. 2. Additional incidental findings, as above.  Assessment and Plan/Recommendations:  46 year old male with family history of colon cancer and multiple second-degree relatives, status post recent episode of acute diverticulitis in the distal descending colon Will schedule for colonoscopy for follow-up, exclude any neoplastic lesion The risks and benefits as well as alternatives of endoscopic procedure(s) have been discussed and reviewed. All questions answered. The patient agrees to proceed.  Bloating & lower abdominal cramps Start probiotic VSL#3, 112 billion units daily for 4 weeks Benefiber 1 tablespoon 2-3 times daily with meals  Increase fluid intake  Noted mild elevation of AST and ALT on labs last month could be in the setting of acute diverticulitis Recheck LFT Hepatitis A, B and C antibody and hep B surface antigen If continues to have elevated transaminases, will consider right upper quadrant ultrasound of liver to further evaluate    K. Denzil Magnuson , MD [phone] Mon-Fri 8a-5p [phone] after 5p, weekends, holidays  CC: Henson, Vickie L, NP-C

## 2017-04-14 LAB — HEPATITIS C ANTIBODY
HEP C AB: NONREACTIVE
SIGNAL TO CUT-OFF: 0.03 (ref <1.00)

## 2017-04-14 LAB — HEPATITIS A ANTIBODY, TOTAL: HEPATITIS A AB,TOTAL: NONREACTIVE

## 2017-04-14 LAB — HEPATITIS B SURFACE ANTIGEN: Hepatitis B Surface Ag: NONREACTIVE

## 2017-04-14 LAB — HEPATITIS B SURFACE ANTIBODY,QUALITATIVE: HEP B S AB: REACTIVE — AB

## 2017-05-10 DIAGNOSIS — K5792 Diverticulitis of intestine, part unspecified, without perforation or abscess without bleeding: Secondary | ICD-10-CM

## 2017-05-10 HISTORY — DX: Diverticulitis of intestine, part unspecified, without perforation or abscess without bleeding: K57.92

## 2017-05-10 HISTORY — PX: COLONOSCOPY: SHX174

## 2017-05-11 ENCOUNTER — Encounter: Payer: Self-pay | Admitting: Gastroenterology

## 2017-05-18 ENCOUNTER — Encounter: Payer: Self-pay | Admitting: Gastroenterology

## 2017-05-18 ENCOUNTER — Other Ambulatory Visit: Payer: Self-pay

## 2017-05-18 ENCOUNTER — Ambulatory Visit (AMBULATORY_SURGERY_CENTER): Payer: 59 | Admitting: Gastroenterology

## 2017-05-18 VITALS — BP 120/76 | HR 73 | Temp 97.8°F | Resp 17 | Ht 67.5 in | Wt 165.0 lb

## 2017-05-18 DIAGNOSIS — Z800 Family history of malignant neoplasm of digestive organs: Secondary | ICD-10-CM

## 2017-05-18 DIAGNOSIS — Z8719 Personal history of other diseases of the digestive system: Secondary | ICD-10-CM | POA: Diagnosis present

## 2017-05-18 DIAGNOSIS — K6389 Other specified diseases of intestine: Secondary | ICD-10-CM | POA: Diagnosis not present

## 2017-05-18 DIAGNOSIS — H6691 Otitis media, unspecified, right ear: Secondary | ICD-10-CM | POA: Diagnosis not present

## 2017-05-18 DIAGNOSIS — D123 Benign neoplasm of transverse colon: Secondary | ICD-10-CM

## 2017-05-18 DIAGNOSIS — Z1211 Encounter for screening for malignant neoplasm of colon: Secondary | ICD-10-CM | POA: Diagnosis not present

## 2017-05-18 MED ORDER — SODIUM CHLORIDE 0.9 % IV SOLN: 500.0000 mL | Freq: Once | INTRAVENOUS | Status: DC

## 2017-05-18 NOTE — Progress Notes (Signed)
A and O x3. Report to RN. Tolerated MAC anesthesia well.

## 2017-05-18 NOTE — Patient Instructions (Signed)
YOU HAD AN ENDOSCOPIC PROCEDURE TODAY AT THE [facility]:   Refer to the procedure report that was given to you for any specific questions about what was found during the examination.  If the procedure report does not answer your questions, please call your gastroenterologist to clarify.  If you requested that your care partner not be given the details of your procedure findings, then the procedure report has been included in a sealed envelope for you to review at your convenience later.  YOU SHOULD EXPECT: Some feelings of bloating in the abdomen. Passage of more gas than usual.  Walking can help get rid of the air that was put into your GI tract during the procedure and reduce the bloating. If you had a lower endoscopy (such as a colonoscopy or flexible sigmoidoscopy) you may notice spotting of blood in your stool or on the toilet paper. If you underwent a bowel prep for your procedure, you may not have a normal bowel movement for a few days.  Please Note:  You might notice some irritation and congestion in your nose or some drainage.  This is from the oxygen used during your procedure.  There is no need for concern and it should clear up in a day or so.  SYMPTOMS TO REPORT IMMEDIATELY:   Following lower endoscopy (colonoscopy or flexible sigmoidoscopy):  Excessive amounts of blood in the stool  Significant tenderness or worsening of abdominal pains  Swelling of the abdomen that is new, acute  Fever of 100F or higher  For urgent or emergent issues, a gastroenterologist can be reached at any hour by calling [phone].   DIET:  We do recommend a small meal at first, but then you may proceed to your regular diet.  Drink plenty of fluids but you should avoid alcoholic beverages for 24 hours.  ACTIVITY:  You should plan to take it easy for the rest of today and you should NOT DRIVE or use heavy machinery until tomorrow (because of the sedation medicines used during the test).     FOLLOW UP: Our staff will call the number listed on your records the next business day following your procedure to check on you and address any questions or concerns that you may have regarding the information given to you following your procedure. If we do not reach you, we will leave a message.  However, if you are feeling well and you are not experiencing any problems, there is no need to return our call.  We will assume that you have returned to your regular daily activities without incident.  If any biopsies were taken you will be contacted by phone or by letter within the next 1-3 weeks.  Please call us at [phone] if you have not heard about the biopsies in 3 weeks.   Await for biopsy results to determine next repeat Colonoscopy screening Polyps (handout given) Diverticulosis (handout given) Hemorrhoids (handout given)   SIGNATURES/CONFIDENTIALITY: You and/or your care partner have signed paperwork which will be entered into your electronic medical record.  These signatures attest to the fact that that the information above on your After Visit Summary has been reviewed and is understood.  Full responsibility of the confidentiality of this discharge information lies with you and/or your care-partner. 

## 2017-05-18 NOTE — Op Note (Signed)
Jon Cruz: Jon Cruz Procedure Date: 05/18/2017 10:32 AM MRN: [id number] Endoscopist: Mauri Pole , MD Age: 47 Referring MD:  Date of Birth: Mar 06, 1971 Gender: Male Account #: [id number] Procedure:                Colonoscopy Indications:              Colon cancer screening in patient at increased                            risk: Family history of colorectal cancer in                            multiple 2nd degree relatives Medicines:                Monitored Anesthesia Care Procedure:                Pre-Anesthesia Assessment:                           - Prior to the procedure, a History and Physical                            was performed, and patient medications and                            allergies were reviewed. The patient's tolerance of                            previous anesthesia was also reviewed. The risks                            and benefits of the procedure and the sedation                            options and risks were discussed with the patient.                            All questions were answered, and informed consent                            was obtained. Prior Anticoagulants: The patient has                            taken no previous anticoagulant or antiplatelet                            agents. ASA Grade Assessment: II - A patient with                            mild systemic disease. After reviewing the risks                            and benefits, the patient was deemed in  satisfactory condition to undergo the procedure.                           After obtaining informed consent, the colonoscope                            was passed under direct vision. Throughout the                            procedure, the patient's blood pressure, pulse, and                            oxygen saturations were monitored continuously. The                            Colonoscope was introduced through  the anus and                            advanced to the the cecum, identified by                            appendiceal orifice and ileocecal valve. The                            colonoscopy was performed without difficulty. The                            patient tolerated the procedure well. The quality                            of the bowel preparation was excellent. The                            ileocecal valve, appendiceal orifice, and rectum                            were photographed. Scope In: 10:35:06 AM Scope Out: 10:50:09 AM Scope Withdrawal Time: 0 hours 9 minutes 41 seconds  Total Procedure Duration: 0 hours 15 minutes 3 seconds  Findings:                 The perianal and digital rectal examinations were                            normal.                           A 3 mm polyp was found in the transverse colon. The                            polyp was sessile. The polyp was removed with a                            cold biopsy forceps. Resection and retrieval were  complete.                           Multiple small and large-mouthed diverticula were                            found in the sigmoid colon and descending colon.                            There was no evidence of diverticular bleeding.                           Non-bleeding internal hemorrhoids were found during                            retroflexion. The hemorrhoids were small. Complications:            No immediate complications. Estimated Blood Loss:     Estimated blood loss was minimal. Impression:               - One 3 mm polyp in the transverse colon, removed                            with a cold biopsy forceps. Resected and retrieved.                           - Moderate diverticulosis in the sigmoid colon and                            in the descending colon. There was no evidence of                            diverticular bleeding.                           - Non-bleeding  internal hemorrhoids. Recommendation:           - Patient has a contact number available for                            emergencies. The signs and symptoms of potential                            delayed complications were discussed with the                            patient. Return to normal activities tomorrow.                            Written discharge instructions were provided to the                            patient.                           - Resume previous diet.                           -  Continue present medications.                           - Await pathology results.                           - Repeat colonoscopy date to be determined after                            pending pathology results are reviewed for                            surveillance based on pathology results. Mauri Pole, MD 05/18/2017 10:55:45 AM This report has been signed electronically.

## 2017-05-18 NOTE — Progress Notes (Signed)
Called to room to assist during endoscopic procedure.  Patient ID and intended procedure confirmed with present staff. Received instructions for my participation in the procedure from the performing physician.  

## 2017-05-19 ENCOUNTER — Telehealth: Payer: Self-pay | Admitting: *Deleted

## 2017-05-19 ENCOUNTER — Telehealth: Payer: Self-pay

## 2017-05-19 NOTE — Telephone Encounter (Signed)
Left message

## 2017-05-19 NOTE — Telephone Encounter (Signed)
  Follow up Call-  Call back number 05/18/2017  Post procedure Call Back phone  # [phone]  Permission to leave phone message Yes  Some recent data might be hidden     Patient questions:  Message left to call us if necessary.

## 2017-05-25 ENCOUNTER — Encounter: Payer: Self-pay | Admitting: Gastroenterology

## 2017-06-29 DIAGNOSIS — L821 Other seborrheic keratosis: Secondary | ICD-10-CM | POA: Diagnosis not present

## 2017-06-29 DIAGNOSIS — D225 Melanocytic nevi of trunk: Secondary | ICD-10-CM | POA: Diagnosis not present

## 2017-06-29 DIAGNOSIS — L918 Other hypertrophic disorders of the skin: Secondary | ICD-10-CM | POA: Diagnosis not present

## 2017-07-20 ENCOUNTER — Other Ambulatory Visit: Payer: Self-pay

## 2017-07-20 ENCOUNTER — Ambulatory Visit: Payer: 59 | Admitting: Family Medicine

## 2017-07-20 ENCOUNTER — Encounter: Payer: Self-pay | Admitting: Family Medicine

## 2017-07-20 VITALS — BP 120/80 | HR 96 | Temp 98.2°F | Ht 67.0 in | Wt 161.8 lb

## 2017-07-20 DIAGNOSIS — J0100 Acute maxillary sinusitis, unspecified: Secondary | ICD-10-CM | POA: Diagnosis not present

## 2017-07-20 MED ORDER — LEVOFLOXACIN 500 MG PO TABS: 500.0000 mg | ORAL_TABLET | Freq: Every day | ORAL | 0 refills | Status: DC

## 2017-07-20 MED ORDER — TRIAMCINOLONE ACETONIDE 55 MCG/ACT NA AERO: 1.0000 | INHALATION_SPRAY | Freq: Two times a day (BID) | NASAL | 12 refills | Status: AC

## 2017-07-20 NOTE — Progress Notes (Signed)
3/13/201912:23 PM  Wallace Going 1970/07/20, 47 y.o. male [id number]  Chief Complaint  Patient presents with  . Nasal Congestion    has been having cold symptoms for 1 wk, nasal congestion and bodyaches. Also having ear ringing is both ears    HPI:   Patient is a 47 y.o. male with past medical history significant for seasonal allergies who presents today for 1 week of waxing and waning nasal congestion, sinus pressure, right ear pressure, PND and mild cough, He had body aches today, but no fever or chills. He has been taking zyrtec-D which helps some. He was treated in Jan 2019 for RIGHT ear infection. He denies recurring sinus infections, sinus surgery or tobacco use. He denies any SOB, nausea, vomiting.  Depression screen PHQ 2/9 07/20/2017  Decreased Interest 0  Down, Depressed, Hopeless 0  PHQ - 2 Score 0    No Known Allergies  Prior to Admission medications   Medication Sig Start Date End Date Taking? Authorizing Provider  cetirizine-pseudoephedrine (ZYRTEC-D) 5-120 MG per tablet Take 1 tablet by mouth 2 (two) times daily.   Yes [provider]    Past Medical History:  Diagnosis Date  . Abnormal findings on diagnostic imaging of liver 02/21/2017   Incidental finding on CT- multiple subcentimeter low attenuation lesions, likely cysts per radiologist  . Allergy   . Aortic atherosclerosis ([place])    on CT  . Diverticulitis   . Elevated ALT measurement   . GERD (gastroesophageal reflux disease)    takes otc meds    Past Surgical History:  Procedure Laterality Date  . APPENDECTOMY    . INGUINAL HERNIA REPAIR      Social History   Tobacco Use  . Smoking status: Never Smoker  . Smokeless tobacco: Never Used  Substance Use Topics  . Alcohol use: Yes    Comment: rare    Family History  Problem Relation Age of Onset  . Colon polyps Mother   . Hypertension Mother   . Colon polyps Father   . Hypertension Father   . Breast cancer Maternal Aunt   .  Colon cancer Maternal Aunt   . Cancer Maternal Grandfather   . Cancer Paternal Grandmother   . Cancer Paternal Grandfather     ROS Per hpi  OBJECTIVE:  Blood pressure 120/80, pulse 96, temperature 98.2 F (36.8 C), temperature source Oral, height 5\' 7"  (1.702 m), weight 161 lb 12.8 oz (73.4 kg), SpO2 99 %.  Physical Exam  Constitutional: He is oriented to person, place, and time and well-developed, well-nourished, and in no distress.  HENT:  Head: Normocephalic and atraumatic.  Right Ear: Hearing, tympanic membrane, external ear and ear canal normal.  Left Ear: Hearing, tympanic membrane, external ear and ear canal normal.  Nose: Right sinus exhibits maxillary sinus tenderness and frontal sinus tenderness (mild ). Left sinus exhibits no maxillary sinus tenderness and no frontal sinus tenderness.  Mouth/Throat: Oropharynx is clear and moist. No oropharyngeal exudate.  Eyes: Conjunctivae and EOM are normal. Pupils are equal, round, and reactive to light.  Neck: Neck supple.  Cardiovascular: Normal rate and regular rhythm. Exam reveals no gallop and no friction rub.  No murmur heard. Pulmonary/Chest: Effort normal and breath sounds normal. He has no wheezes. He has no rales.  Lymphadenopathy:    He has no cervical adenopathy.  Neurological: He is alert and oriented to person, place, and time. Gait normal.  Skin: Skin is warm and dry.    ASSESSMENT  and PLAN  1. Acute non-recurrent maxillary sinusitis Discussed increasing conservative measures with adding nasal saline washes and nasacort. Discussed delayed use of antibiotics. RTC precautions.   Other orders - levofloxacin (LEVAQUIN) 500 MG tablet; Take 1 tablet (500 mg total) by mouth daily. - triamcinolone (NASACORT) 55 MCG/ACT AERO nasal inhaler; Place 1 spray into the nose 2 (two) times daily.  Return if symptoms worsen or fail to improve.    Rutherford Guys, MD [facility] [place], [place]  [address] Ph.  [phone] Fax [phone]

## 2017-07-20 NOTE — Patient Instructions (Addendum)
1. Continue with zyrtec D, add nasal saline washes and nasacort, both 1 spray twice a day 2. Start the antibiotic (levofloxacin) if you have worsening sinus pain, purulent drainage and fever   IF you received an x-ray today, you will receive an invoice from [facility]. Please contact [facility] at [phone] with questions or concerns regarding your invoice.   IF you received labwork today, you will receive an invoice from [facility]. Please contact [facility] at [phone] with questions or concerns regarding your invoice.   Our billing staff will not be able to assist you with questions regarding bills from these companies.  You will be contacted with the lab results as soon as they are available. The fastest way to get your results is to activate your My Chart account. Instructions are located on the last page of this paperwork. If you have not heard from [place] regarding the results in 2 weeks, please contact this office.

## 2017-08-24 ENCOUNTER — Encounter: Payer: Self-pay | Admitting: Family Medicine

## 2017-08-24 ENCOUNTER — Ambulatory Visit: Payer: 59 | Admitting: Family Medicine

## 2017-08-24 VITALS — BP 118/80 | HR 75 | Temp 97.6°F | Ht 66.5 in | Wt 156.8 lb

## 2017-08-24 DIAGNOSIS — Z0000 Encounter for general adult medical examination without abnormal findings: Secondary | ICD-10-CM

## 2017-08-24 DIAGNOSIS — I700 Atherosclerosis of aorta: Secondary | ICD-10-CM

## 2017-08-24 DIAGNOSIS — Z800 Family history of malignant neoplasm of digestive organs: Secondary | ICD-10-CM

## 2017-08-24 DIAGNOSIS — K219 Gastro-esophageal reflux disease without esophagitis: Secondary | ICD-10-CM

## 2017-08-24 DIAGNOSIS — Z8719 Personal history of other diseases of the digestive system: Secondary | ICD-10-CM | POA: Diagnosis not present

## 2017-08-24 DIAGNOSIS — J309 Allergic rhinitis, unspecified: Secondary | ICD-10-CM

## 2017-08-24 LAB — POCT URINALYSIS DIP (PROADVANTAGE DEVICE)
Bilirubin, UA: NEGATIVE
GLUCOSE UA: NEGATIVE mg/dL
Ketones, POC UA: NEGATIVE mg/dL
Leukocytes, UA: NEGATIVE
NITRITE UA: NEGATIVE
PROTEIN UA: NEGATIVE mg/dL
RBC UA: NEGATIVE
SPECIFIC GRAVITY, URINE: 1.02
UUROB: 3.5
pH, UA: 6 (ref 5.0–8.0)

## 2017-08-24 NOTE — Progress Notes (Signed)
   Subjective:    Patient ID: Jon Cruz, male    DOB: November 12, 1970, 47 y.o.   MRN: [id number]  HPI He is here for complete examination.  He has a previous history of diverticulitis and did have a colonoscopy for follow-up on that.  He also has reflux type symptoms but finds that a probiotic helps control those symptoms.  His allergies are under good control with Zyrtec and Nasacort.  Review of the record also indicates aortic atherosclerosis.  There is no family history of heart disease.  He has no other concerns or complaints.  Work and home life are Cruz well.  He has twin 66-year-old daughters.  Family and social history as well as health maintenance and immunizations was reviewed   Review of Systems  All other systems reviewed and are negative.      Objective:   Physical Exam BP 118/80   Pulse 75   Temp 97.6 F (36.4 C)   Ht 5' 6.5" (1.689 m)   Wt 156 lb 12.8 oz (71.1 kg)   SpO2 98%   BMI 24.93 kg/m   General Appearance:    Alert, cooperative, no distress, appears stated age  Head:    Normocephalic, without obvious abnormality, atraumatic  Eyes:    PERRL, conjunctiva/corneas clear, EOM's intact, fundi    benign  Ears:    Normal TM's and external ear canals  Nose:   Nares normal, mucosa normal, no drainage or sinus   tenderness  Throat:   Lips, mucosa, and tongue normal; teeth and gums normal  Neck:   Supple, no lymphadenopathy;  thyroid:  no   enlargement/tenderness/nodules; no carotid   bruit or JVD     Lungs:     Clear to auscultation bilaterally without wheezes, rales or     ronchi; respirations unlabored      Heart:    Regular rate and rhythm, S1 and S2 normal, no murmur, rub   or gallop  Breast Exam:    No chest wall tenderness, masses or gynecomastia  Abdomen:     Soft, non-tender, nondistended, normoactive bowel sounds,    no masses, no hepatosplenomegaly  Genitalia:    Normal male external genitalia without lesions.  Testicles without masses.  No inguinal  hernias.  Rectal:   Deferred  Extremities:   No clubbing, cyanosis or edema  Pulses:   2+ and symmetric all extremities  Skin:   Skin color, texture, turgor normal, no rashes or lesions  Lymph nodes:   Cervical, supraclavicular, and axillary nodes normal  Neurologic:   CNII-XII intact, normal strength, sensation and gait; reflexes 2+ and symmetric throughout          Psych:   Normal mood, affect, hygiene and grooming.    EKG is normal.      Assessment & Plan:  Routine general medical examination at a health care facility - Plan: EKG 12-Lead, CBC with Differential/Platelet, Lipid panel, POCT Urinalysis DIP (Proadvantage Device)  Aortic atherosclerosis (HCC) - Plan: EKG 12-Lead  History of diverticulitis  Family hx of colon cancer  Allergic rhinitis, mild  Gastroesophageal reflux disease without esophagitis I discussed the diagnosis of atherosclerosis with him.  As his major risk factor.  His EKG was normal.  Discussed the need for him to maintain proper exercise and diet which she seems to be doing.  He will continue on his allergy medications.  He is scheduled for another colonoscopy in roughly 5 years.

## 2017-08-25 LAB — CBC WITH DIFFERENTIAL/PLATELET
BASOS ABS: 0 10*3/uL (ref 0.0–0.2)
Basos: 0 %
EOS (ABSOLUTE): 0.4 10*3/uL (ref 0.0–0.4)
Eos: 5 %
Hematocrit: 44.1 % (ref 37.5–51.0)
Hemoglobin: 15.2 g/dL (ref 13.0–17.7)
Immature Grans (Abs): 0 10*3/uL (ref 0.0–0.1)
Immature Granulocytes: 0 %
LYMPHS ABS: 1.9 10*3/uL (ref 0.7–3.1)
Lymphs: 26 %
MCH: 29.8 pg (ref 26.6–33.0)
MCHC: 34.5 g/dL (ref 31.5–35.7)
MCV: 87 fL (ref 79–97)
MONOCYTES: 8 %
MONOS ABS: 0.6 10*3/uL (ref 0.1–0.9)
NEUTROS ABS: 4.4 10*3/uL (ref 1.4–7.0)
Neutrophils: 61 %
Platelets: 266 10*3/uL (ref 150–379)
RBC: 5.1 x10E6/uL (ref 4.14–5.80)
RDW: 13.9 % (ref 12.3–15.4)
WBC: 7.3 10*3/uL (ref 3.4–10.8)

## 2017-08-25 LAB — LIPID PANEL
CHOLESTEROL TOTAL: 191 mg/dL (ref 100–199)
Chol/HDL Ratio: 4 ratio (ref 0.0–5.0)
HDL: 48 mg/dL (ref >39)
LDL Calculated: 117 mg/dL — ABNORMAL HIGH (ref 0–99)
TRIGLYCERIDES: 128 mg/dL (ref 0–149)
VLDL Cholesterol Cal: 26 mg/dL (ref 5–40)

## 2018-06-29 DIAGNOSIS — L570 Actinic keratosis: Secondary | ICD-10-CM | POA: Diagnosis not present

## 2018-06-29 DIAGNOSIS — D225 Melanocytic nevi of trunk: Secondary | ICD-10-CM | POA: Diagnosis not present

## 2018-06-29 DIAGNOSIS — D2271 Melanocytic nevi of right lower limb, including hip: Secondary | ICD-10-CM | POA: Diagnosis not present

## 2018-06-29 DIAGNOSIS — L814 Other melanin hyperpigmentation: Secondary | ICD-10-CM | POA: Diagnosis not present

## 2019-02-19 DIAGNOSIS — K219 Gastro-esophageal reflux disease without esophagitis: Secondary | ICD-10-CM | POA: Diagnosis not present

## 2019-02-20 ENCOUNTER — Encounter: Payer: Self-pay | Admitting: Family Medicine

## 2019-02-20 ENCOUNTER — Ambulatory Visit: Payer: BC Managed Care – PPO | Admitting: Family Medicine

## 2019-02-20 ENCOUNTER — Other Ambulatory Visit: Payer: Self-pay

## 2019-02-20 ENCOUNTER — Telehealth: Payer: Self-pay | Admitting: Internal Medicine

## 2019-02-20 NOTE — Progress Notes (Signed)
He was seen recently in an urgent care center and his blood pressure was 141/90.  He has concerns over this.  He was also treated for reflux.  He is done nicely on the omeprazole for his reflux symptoms. I explained that blood pressure reading like this would require a follow-up in approximately 2 months.  Explained that at this point he has absolutely nothing to worry about and recommended that he make an appointment for that.

## 2019-02-20 NOTE — Telephone Encounter (Signed)
Called Jon Cruz to get him checked in for his virtual for acid reflux. Jon Cruz states he was seen at urgent care yesterday for GERD and was given medicine that seems to help. Jon Cruz is still having allergies, coughing and drainage but UC told him it could be coming from the Heartburn but he is worried about his BP being high  PER vickie. Jon Cruz can not come into the office for his BP as he is having sick symptoms. Advised Jon Cruz to go get a bp cuff machine to monitor his bp for a couple weeks and then call us back and we can do a virtual with him on his bp. If he still has any sick symptoms he needs to go back to UC to be reevaluated. Jon Cruz will check his bp.

## 2019-04-06 DIAGNOSIS — Z20828 Contact with and (suspected) exposure to other viral communicable diseases: Secondary | ICD-10-CM | POA: Diagnosis not present

## 2019-09-13 DIAGNOSIS — H1045 Other chronic allergic conjunctivitis: Secondary | ICD-10-CM | POA: Diagnosis not present

## 2019-09-13 DIAGNOSIS — J3081 Allergic rhinitis due to animal (cat) (dog) hair and dander: Secondary | ICD-10-CM | POA: Diagnosis not present

## 2019-09-13 DIAGNOSIS — J301 Allergic rhinitis due to pollen: Secondary | ICD-10-CM | POA: Diagnosis not present

## 2019-09-13 DIAGNOSIS — J3089 Other allergic rhinitis: Secondary | ICD-10-CM | POA: Diagnosis not present

## 2019-09-19 DIAGNOSIS — J301 Allergic rhinitis due to pollen: Secondary | ICD-10-CM | POA: Diagnosis not present

## 2019-09-20 DIAGNOSIS — J3089 Other allergic rhinitis: Secondary | ICD-10-CM | POA: Diagnosis not present

## 2019-09-20 DIAGNOSIS — Z20822 Contact with and (suspected) exposure to covid-19: Secondary | ICD-10-CM | POA: Diagnosis not present

## 2019-09-28 DIAGNOSIS — L918 Other hypertrophic disorders of the skin: Secondary | ICD-10-CM | POA: Diagnosis not present

## 2019-09-28 DIAGNOSIS — D2271 Melanocytic nevi of right lower limb, including hip: Secondary | ICD-10-CM | POA: Diagnosis not present

## 2019-09-28 DIAGNOSIS — L821 Other seborrheic keratosis: Secondary | ICD-10-CM | POA: Diagnosis not present

## 2019-09-28 DIAGNOSIS — D225 Melanocytic nevi of trunk: Secondary | ICD-10-CM | POA: Diagnosis not present

## 2019-10-31 DIAGNOSIS — J3089 Other allergic rhinitis: Secondary | ICD-10-CM | POA: Diagnosis not present

## 2019-10-31 DIAGNOSIS — J301 Allergic rhinitis due to pollen: Secondary | ICD-10-CM | POA: Diagnosis not present

## 2019-11-05 DIAGNOSIS — J301 Allergic rhinitis due to pollen: Secondary | ICD-10-CM | POA: Diagnosis not present

## 2019-11-05 DIAGNOSIS — J3089 Other allergic rhinitis: Secondary | ICD-10-CM | POA: Diagnosis not present

## 2019-11-08 DIAGNOSIS — J3089 Other allergic rhinitis: Secondary | ICD-10-CM | POA: Diagnosis not present

## 2019-11-08 DIAGNOSIS — J301 Allergic rhinitis due to pollen: Secondary | ICD-10-CM | POA: Diagnosis not present

## 2019-11-08 DIAGNOSIS — J3081 Allergic rhinitis due to animal (cat) (dog) hair and dander: Secondary | ICD-10-CM | POA: Diagnosis not present

## 2019-11-16 DIAGNOSIS — J3089 Other allergic rhinitis: Secondary | ICD-10-CM | POA: Diagnosis not present

## 2019-11-16 DIAGNOSIS — J301 Allergic rhinitis due to pollen: Secondary | ICD-10-CM | POA: Diagnosis not present

## 2019-11-20 DIAGNOSIS — J3089 Other allergic rhinitis: Secondary | ICD-10-CM | POA: Diagnosis not present

## 2019-11-20 DIAGNOSIS — J301 Allergic rhinitis due to pollen: Secondary | ICD-10-CM | POA: Diagnosis not present

## 2019-11-22 DIAGNOSIS — J3089 Other allergic rhinitis: Secondary | ICD-10-CM | POA: Diagnosis not present

## 2019-11-22 DIAGNOSIS — J301 Allergic rhinitis due to pollen: Secondary | ICD-10-CM | POA: Diagnosis not present

## 2019-11-26 DIAGNOSIS — J301 Allergic rhinitis due to pollen: Secondary | ICD-10-CM | POA: Diagnosis not present

## 2019-11-26 DIAGNOSIS — J3089 Other allergic rhinitis: Secondary | ICD-10-CM | POA: Diagnosis not present

## 2019-11-28 DIAGNOSIS — J3089 Other allergic rhinitis: Secondary | ICD-10-CM | POA: Diagnosis not present

## 2019-11-28 DIAGNOSIS — J301 Allergic rhinitis due to pollen: Secondary | ICD-10-CM | POA: Diagnosis not present

## 2019-12-03 DIAGNOSIS — J3089 Other allergic rhinitis: Secondary | ICD-10-CM | POA: Diagnosis not present

## 2019-12-03 DIAGNOSIS — J301 Allergic rhinitis due to pollen: Secondary | ICD-10-CM | POA: Diagnosis not present

## 2019-12-03 DIAGNOSIS — J3081 Allergic rhinitis due to animal (cat) (dog) hair and dander: Secondary | ICD-10-CM | POA: Diagnosis not present

## 2019-12-05 DIAGNOSIS — J3089 Other allergic rhinitis: Secondary | ICD-10-CM | POA: Diagnosis not present

## 2019-12-05 DIAGNOSIS — J301 Allergic rhinitis due to pollen: Secondary | ICD-10-CM | POA: Diagnosis not present

## 2019-12-10 DIAGNOSIS — J3089 Other allergic rhinitis: Secondary | ICD-10-CM | POA: Diagnosis not present

## 2019-12-10 DIAGNOSIS — J301 Allergic rhinitis due to pollen: Secondary | ICD-10-CM | POA: Diagnosis not present

## 2019-12-12 DIAGNOSIS — J3089 Other allergic rhinitis: Secondary | ICD-10-CM | POA: Diagnosis not present

## 2019-12-12 DIAGNOSIS — J301 Allergic rhinitis due to pollen: Secondary | ICD-10-CM | POA: Diagnosis not present

## 2019-12-21 DIAGNOSIS — J3089 Other allergic rhinitis: Secondary | ICD-10-CM | POA: Diagnosis not present

## 2019-12-21 DIAGNOSIS — J301 Allergic rhinitis due to pollen: Secondary | ICD-10-CM | POA: Diagnosis not present

## 2019-12-24 DIAGNOSIS — J301 Allergic rhinitis due to pollen: Secondary | ICD-10-CM | POA: Diagnosis not present

## 2019-12-24 DIAGNOSIS — J3089 Other allergic rhinitis: Secondary | ICD-10-CM | POA: Diagnosis not present

## 2019-12-28 DIAGNOSIS — J301 Allergic rhinitis due to pollen: Secondary | ICD-10-CM | POA: Diagnosis not present

## 2019-12-28 DIAGNOSIS — J3089 Other allergic rhinitis: Secondary | ICD-10-CM | POA: Diagnosis not present

## 2020-01-03 DIAGNOSIS — J3089 Other allergic rhinitis: Secondary | ICD-10-CM | POA: Diagnosis not present

## 2020-01-03 DIAGNOSIS — J301 Allergic rhinitis due to pollen: Secondary | ICD-10-CM | POA: Diagnosis not present

## 2020-01-09 DIAGNOSIS — J3089 Other allergic rhinitis: Secondary | ICD-10-CM | POA: Diagnosis not present

## 2020-01-09 DIAGNOSIS — J301 Allergic rhinitis due to pollen: Secondary | ICD-10-CM | POA: Diagnosis not present

## 2020-01-11 DIAGNOSIS — J301 Allergic rhinitis due to pollen: Secondary | ICD-10-CM | POA: Diagnosis not present

## 2020-01-11 DIAGNOSIS — J3089 Other allergic rhinitis: Secondary | ICD-10-CM | POA: Diagnosis not present

## 2020-01-18 DIAGNOSIS — J3089 Other allergic rhinitis: Secondary | ICD-10-CM | POA: Diagnosis not present

## 2020-01-18 DIAGNOSIS — J301 Allergic rhinitis due to pollen: Secondary | ICD-10-CM | POA: Diagnosis not present

## 2020-01-25 DIAGNOSIS — J301 Allergic rhinitis due to pollen: Secondary | ICD-10-CM | POA: Diagnosis not present

## 2020-01-25 DIAGNOSIS — J3089 Other allergic rhinitis: Secondary | ICD-10-CM | POA: Diagnosis not present

## 2020-02-01 DIAGNOSIS — J3089 Other allergic rhinitis: Secondary | ICD-10-CM | POA: Diagnosis not present

## 2020-02-01 DIAGNOSIS — J301 Allergic rhinitis due to pollen: Secondary | ICD-10-CM | POA: Diagnosis not present

## 2020-02-08 DIAGNOSIS — J3081 Allergic rhinitis due to animal (cat) (dog) hair and dander: Secondary | ICD-10-CM | POA: Diagnosis not present

## 2020-02-08 DIAGNOSIS — J301 Allergic rhinitis due to pollen: Secondary | ICD-10-CM | POA: Diagnosis not present

## 2020-02-08 DIAGNOSIS — J3089 Other allergic rhinitis: Secondary | ICD-10-CM | POA: Diagnosis not present

## 2020-02-11 DIAGNOSIS — J3089 Other allergic rhinitis: Secondary | ICD-10-CM | POA: Diagnosis not present

## 2020-02-11 DIAGNOSIS — J301 Allergic rhinitis due to pollen: Secondary | ICD-10-CM | POA: Diagnosis not present

## 2020-02-11 DIAGNOSIS — J3081 Allergic rhinitis due to animal (cat) (dog) hair and dander: Secondary | ICD-10-CM | POA: Diagnosis not present

## 2020-02-22 DIAGNOSIS — J301 Allergic rhinitis due to pollen: Secondary | ICD-10-CM | POA: Diagnosis not present

## 2020-02-22 DIAGNOSIS — J3089 Other allergic rhinitis: Secondary | ICD-10-CM | POA: Diagnosis not present

## 2020-02-28 DIAGNOSIS — J301 Allergic rhinitis due to pollen: Secondary | ICD-10-CM | POA: Diagnosis not present

## 2020-02-28 DIAGNOSIS — J3089 Other allergic rhinitis: Secondary | ICD-10-CM | POA: Diagnosis not present

## 2020-02-29 DIAGNOSIS — J301 Allergic rhinitis due to pollen: Secondary | ICD-10-CM | POA: Diagnosis not present

## 2020-03-03 DIAGNOSIS — J3089 Other allergic rhinitis: Secondary | ICD-10-CM | POA: Diagnosis not present

## 2020-03-10 DIAGNOSIS — J3081 Allergic rhinitis due to animal (cat) (dog) hair and dander: Secondary | ICD-10-CM | POA: Diagnosis not present

## 2020-03-10 DIAGNOSIS — J3089 Other allergic rhinitis: Secondary | ICD-10-CM | POA: Diagnosis not present

## 2020-03-10 DIAGNOSIS — J301 Allergic rhinitis due to pollen: Secondary | ICD-10-CM | POA: Diagnosis not present

## 2020-03-21 DIAGNOSIS — J301 Allergic rhinitis due to pollen: Secondary | ICD-10-CM | POA: Diagnosis not present

## 2020-03-21 DIAGNOSIS — J3089 Other allergic rhinitis: Secondary | ICD-10-CM | POA: Diagnosis not present

## 2020-04-02 DIAGNOSIS — J301 Allergic rhinitis due to pollen: Secondary | ICD-10-CM | POA: Diagnosis not present

## 2020-04-02 DIAGNOSIS — J3089 Other allergic rhinitis: Secondary | ICD-10-CM | POA: Diagnosis not present

## 2020-04-10 DIAGNOSIS — J3089 Other allergic rhinitis: Secondary | ICD-10-CM | POA: Diagnosis not present

## 2020-04-10 DIAGNOSIS — J301 Allergic rhinitis due to pollen: Secondary | ICD-10-CM | POA: Diagnosis not present

## 2020-04-10 DIAGNOSIS — J3081 Allergic rhinitis due to animal (cat) (dog) hair and dander: Secondary | ICD-10-CM | POA: Diagnosis not present

## 2020-04-17 DIAGNOSIS — J301 Allergic rhinitis due to pollen: Secondary | ICD-10-CM | POA: Diagnosis not present

## 2020-04-17 DIAGNOSIS — J3089 Other allergic rhinitis: Secondary | ICD-10-CM | POA: Diagnosis not present

## 2020-04-24 DIAGNOSIS — J301 Allergic rhinitis due to pollen: Secondary | ICD-10-CM | POA: Diagnosis not present

## 2020-04-24 DIAGNOSIS — J3081 Allergic rhinitis due to animal (cat) (dog) hair and dander: Secondary | ICD-10-CM | POA: Diagnosis not present

## 2020-04-24 DIAGNOSIS — J3089 Other allergic rhinitis: Secondary | ICD-10-CM | POA: Diagnosis not present

## 2020-04-28 ENCOUNTER — Ambulatory Visit: Payer: BC Managed Care – PPO | Admitting: Medical

## 2020-04-28 ENCOUNTER — Encounter: Payer: Self-pay | Admitting: Medical

## 2020-04-28 ENCOUNTER — Other Ambulatory Visit: Payer: Self-pay

## 2020-04-28 VITALS — BP 130/88 | HR 87 | Temp 98.5°F | Ht 66.5 in | Wt 172.6 lb

## 2020-04-28 DIAGNOSIS — R109 Unspecified abdominal pain: Secondary | ICD-10-CM | POA: Insufficient documentation

## 2020-04-28 DIAGNOSIS — K5792 Diverticulitis of intestine, part unspecified, without perforation or abscess without bleeding: Secondary | ICD-10-CM | POA: Diagnosis not present

## 2020-04-28 MED ORDER — AMOXICILLIN-POT CLAVULANATE 875-125 MG PO TABS: 1.0000 | ORAL_TABLET | Freq: Two times a day (BID) | ORAL | 0 refills | Status: DC

## 2020-04-28 NOTE — Progress Notes (Signed)
Subjective:  Jon Cruz is a 49 y.o. male who presents for Chief Complaint  Patient presents with  . Abdominal Pain    With mucus in bm      Here for possible diverticulitis.  He notes hx/o episode of diverticulitis a few years ago, had same symptoms at that time.  He notes 1 day hx/o abdominal pain in lower abdomen, bloating, some mucous in stool, some loose stool.  Gassy, bloated.  Had organish stool but no frank blood.  No body aches, no fever, no chills, no nausea, no vomiting, no chest pain, no back pain.   Typically has more than 1 solid BM daily, no hx/o constipation.   No urinary c/o.  This feels different than other more common abdominal upset.   No other aggravating or relieving factors. No other complaint.  The following portions of the patient's history were reviewed and updated as appropriate: allergies, current medications, past family history, past medical history, past social history, past surgical history and problem list.  ROS Otherwise as in subjective above  Objective: BP 130/88   Pulse 87   Temp 98.5 F (36.9 C)   Ht 5' 6.5" (1.689 m)   Wt 172 lb 9.6 oz (78.3 kg)   SpO2 98%   BMI 27.44 kg/m   General appearance: alert, no distress, well developed, well nourished Abdomen: +somewhat decreased bs, slightly distended, mild lower tendnerss, no masses, no hepatomegaly, no splenomegaly Pulses: 2+ radial pulses, 2+ pedal pulses, normal cap refill Ext: no edema   Assessment: Encounter Diagnoses  Name Primary?  . Abdominal pain, unspecified abdominal location Yes  . Diverticulitis      Plan: I reviewed 2018 CT abdomen pelvis + for diverticulitis and colonoscopy report from 2019 showing diverticulosis in sigmoid and descending colon.   Will begin medication below empirically for diverticulitis.  Rest, clear fluids only the next 24-48 hours.  No solids for the next 24 hours, tylenol for pain, but if worsening pain, call back.    If worse call, recheck, or go  to the ED.  Jon Cruz was seen today for abdominal pain.  Diagnoses and all orders for this visit:  Abdominal pain, unspecified abdominal location  Diverticulitis  Other orders -     amoxicillin-clavulanate (AUGMENTIN) 875-125 MG tablet; Take 1 tablet by mouth 2 (two) times daily.    Follow up: 24-48 hours

## 2020-04-28 NOTE — Patient Instructions (Signed)

## 2020-05-08 DIAGNOSIS — J301 Allergic rhinitis due to pollen: Secondary | ICD-10-CM | POA: Diagnosis not present

## 2020-05-08 DIAGNOSIS — J3081 Allergic rhinitis due to animal (cat) (dog) hair and dander: Secondary | ICD-10-CM | POA: Diagnosis not present

## 2020-05-08 DIAGNOSIS — J3089 Other allergic rhinitis: Secondary | ICD-10-CM | POA: Diagnosis not present

## 2020-05-15 DIAGNOSIS — J3081 Allergic rhinitis due to animal (cat) (dog) hair and dander: Secondary | ICD-10-CM | POA: Diagnosis not present

## 2020-05-15 DIAGNOSIS — J301 Allergic rhinitis due to pollen: Secondary | ICD-10-CM | POA: Diagnosis not present

## 2020-05-15 DIAGNOSIS — J3089 Other allergic rhinitis: Secondary | ICD-10-CM | POA: Diagnosis not present

## 2020-05-20 DIAGNOSIS — J301 Allergic rhinitis due to pollen: Secondary | ICD-10-CM | POA: Diagnosis not present

## 2020-05-20 DIAGNOSIS — J3089 Other allergic rhinitis: Secondary | ICD-10-CM | POA: Diagnosis not present

## 2020-05-20 DIAGNOSIS — H1045 Other chronic allergic conjunctivitis: Secondary | ICD-10-CM | POA: Diagnosis not present

## 2020-05-20 DIAGNOSIS — J3081 Allergic rhinitis due to animal (cat) (dog) hair and dander: Secondary | ICD-10-CM | POA: Diagnosis not present

## 2020-05-22 DIAGNOSIS — J301 Allergic rhinitis due to pollen: Secondary | ICD-10-CM | POA: Diagnosis not present

## 2020-05-22 DIAGNOSIS — J3089 Other allergic rhinitis: Secondary | ICD-10-CM | POA: Diagnosis not present

## 2020-05-30 DIAGNOSIS — J3089 Other allergic rhinitis: Secondary | ICD-10-CM | POA: Diagnosis not present

## 2020-05-30 DIAGNOSIS — J301 Allergic rhinitis due to pollen: Secondary | ICD-10-CM | POA: Diagnosis not present

## 2020-06-06 DIAGNOSIS — J3081 Allergic rhinitis due to animal (cat) (dog) hair and dander: Secondary | ICD-10-CM | POA: Diagnosis not present

## 2020-06-06 DIAGNOSIS — J301 Allergic rhinitis due to pollen: Secondary | ICD-10-CM | POA: Diagnosis not present

## 2020-06-06 DIAGNOSIS — J3089 Other allergic rhinitis: Secondary | ICD-10-CM | POA: Diagnosis not present

## 2020-06-19 DIAGNOSIS — J3089 Other allergic rhinitis: Secondary | ICD-10-CM | POA: Diagnosis not present

## 2020-06-19 DIAGNOSIS — J301 Allergic rhinitis due to pollen: Secondary | ICD-10-CM | POA: Diagnosis not present

## 2020-06-19 DIAGNOSIS — J3081 Allergic rhinitis due to animal (cat) (dog) hair and dander: Secondary | ICD-10-CM | POA: Diagnosis not present

## 2020-06-25 DIAGNOSIS — J3089 Other allergic rhinitis: Secondary | ICD-10-CM | POA: Diagnosis not present

## 2020-06-25 DIAGNOSIS — J301 Allergic rhinitis due to pollen: Secondary | ICD-10-CM | POA: Diagnosis not present

## 2020-07-01 DIAGNOSIS — J3089 Other allergic rhinitis: Secondary | ICD-10-CM | POA: Diagnosis not present

## 2020-07-01 DIAGNOSIS — J301 Allergic rhinitis due to pollen: Secondary | ICD-10-CM | POA: Diagnosis not present

## 2020-07-09 DIAGNOSIS — J3081 Allergic rhinitis due to animal (cat) (dog) hair and dander: Secondary | ICD-10-CM | POA: Diagnosis not present

## 2020-07-09 DIAGNOSIS — J301 Allergic rhinitis due to pollen: Secondary | ICD-10-CM | POA: Diagnosis not present

## 2020-07-09 DIAGNOSIS — J3089 Other allergic rhinitis: Secondary | ICD-10-CM | POA: Diagnosis not present

## 2020-07-17 DIAGNOSIS — J301 Allergic rhinitis due to pollen: Secondary | ICD-10-CM | POA: Diagnosis not present

## 2020-07-17 DIAGNOSIS — J3089 Other allergic rhinitis: Secondary | ICD-10-CM | POA: Diagnosis not present

## 2020-07-25 DIAGNOSIS — J3089 Other allergic rhinitis: Secondary | ICD-10-CM | POA: Diagnosis not present

## 2020-07-25 DIAGNOSIS — J301 Allergic rhinitis due to pollen: Secondary | ICD-10-CM | POA: Diagnosis not present

## 2020-08-07 DIAGNOSIS — J3089 Other allergic rhinitis: Secondary | ICD-10-CM | POA: Diagnosis not present

## 2020-08-07 DIAGNOSIS — J3081 Allergic rhinitis due to animal (cat) (dog) hair and dander: Secondary | ICD-10-CM | POA: Diagnosis not present

## 2020-08-07 DIAGNOSIS — J301 Allergic rhinitis due to pollen: Secondary | ICD-10-CM | POA: Diagnosis not present

## 2020-08-15 DIAGNOSIS — J3089 Other allergic rhinitis: Secondary | ICD-10-CM | POA: Diagnosis not present

## 2020-08-15 DIAGNOSIS — J301 Allergic rhinitis due to pollen: Secondary | ICD-10-CM | POA: Diagnosis not present

## 2020-08-26 DIAGNOSIS — J3089 Other allergic rhinitis: Secondary | ICD-10-CM | POA: Diagnosis not present

## 2020-08-26 DIAGNOSIS — J301 Allergic rhinitis due to pollen: Secondary | ICD-10-CM | POA: Diagnosis not present

## 2020-09-08 DIAGNOSIS — J301 Allergic rhinitis due to pollen: Secondary | ICD-10-CM | POA: Diagnosis not present

## 2020-09-08 DIAGNOSIS — J3089 Other allergic rhinitis: Secondary | ICD-10-CM | POA: Diagnosis not present

## 2020-09-19 DIAGNOSIS — J3089 Other allergic rhinitis: Secondary | ICD-10-CM | POA: Diagnosis not present

## 2020-09-19 DIAGNOSIS — J301 Allergic rhinitis due to pollen: Secondary | ICD-10-CM | POA: Diagnosis not present

## 2020-09-29 DIAGNOSIS — J301 Allergic rhinitis due to pollen: Secondary | ICD-10-CM | POA: Diagnosis not present

## 2020-09-29 DIAGNOSIS — J3089 Other allergic rhinitis: Secondary | ICD-10-CM | POA: Diagnosis not present

## 2020-10-01 DIAGNOSIS — L821 Other seborrheic keratosis: Secondary | ICD-10-CM | POA: Diagnosis not present

## 2020-10-01 DIAGNOSIS — L918 Other hypertrophic disorders of the skin: Secondary | ICD-10-CM | POA: Diagnosis not present

## 2020-10-01 DIAGNOSIS — L814 Other melanin hyperpigmentation: Secondary | ICD-10-CM | POA: Diagnosis not present

## 2020-10-01 DIAGNOSIS — D225 Melanocytic nevi of trunk: Secondary | ICD-10-CM | POA: Diagnosis not present

## 2020-10-01 DIAGNOSIS — D2271 Melanocytic nevi of right lower limb, including hip: Secondary | ICD-10-CM | POA: Diagnosis not present

## 2020-10-08 DIAGNOSIS — J301 Allergic rhinitis due to pollen: Secondary | ICD-10-CM | POA: Diagnosis not present

## 2020-10-10 DIAGNOSIS — J3089 Other allergic rhinitis: Secondary | ICD-10-CM | POA: Diagnosis not present

## 2020-10-10 DIAGNOSIS — J3081 Allergic rhinitis due to animal (cat) (dog) hair and dander: Secondary | ICD-10-CM | POA: Diagnosis not present

## 2020-10-14 DIAGNOSIS — J301 Allergic rhinitis due to pollen: Secondary | ICD-10-CM | POA: Diagnosis not present

## 2020-10-14 DIAGNOSIS — J3081 Allergic rhinitis due to animal (cat) (dog) hair and dander: Secondary | ICD-10-CM | POA: Diagnosis not present

## 2020-10-14 DIAGNOSIS — J3089 Other allergic rhinitis: Secondary | ICD-10-CM | POA: Diagnosis not present

## 2020-10-31 DIAGNOSIS — J3089 Other allergic rhinitis: Secondary | ICD-10-CM | POA: Diagnosis not present

## 2020-10-31 DIAGNOSIS — J301 Allergic rhinitis due to pollen: Secondary | ICD-10-CM | POA: Diagnosis not present

## 2020-11-07 DIAGNOSIS — J301 Allergic rhinitis due to pollen: Secondary | ICD-10-CM | POA: Diagnosis not present

## 2020-11-07 DIAGNOSIS — J3089 Other allergic rhinitis: Secondary | ICD-10-CM | POA: Diagnosis not present

## 2020-11-09 DIAGNOSIS — S61201A Unspecified open wound of left index finger without damage to nail, initial encounter: Secondary | ICD-10-CM | POA: Diagnosis not present

## 2020-11-09 DIAGNOSIS — S61203A Unspecified open wound of left middle finger without damage to nail, initial encounter: Secondary | ICD-10-CM | POA: Diagnosis not present

## 2020-11-09 DIAGNOSIS — S61205A Unspecified open wound of left ring finger without damage to nail, initial encounter: Secondary | ICD-10-CM | POA: Diagnosis not present

## 2020-11-09 DIAGNOSIS — W268XXA Contact with other sharp object(s), not elsewhere classified, initial encounter: Secondary | ICD-10-CM | POA: Diagnosis not present

## 2020-11-09 DIAGNOSIS — Z23 Encounter for immunization: Secondary | ICD-10-CM | POA: Diagnosis not present

## 2020-11-14 DIAGNOSIS — J301 Allergic rhinitis due to pollen: Secondary | ICD-10-CM | POA: Diagnosis not present

## 2020-11-14 DIAGNOSIS — J3089 Other allergic rhinitis: Secondary | ICD-10-CM | POA: Diagnosis not present

## 2020-11-21 DIAGNOSIS — J3081 Allergic rhinitis due to animal (cat) (dog) hair and dander: Secondary | ICD-10-CM | POA: Diagnosis not present

## 2020-11-21 DIAGNOSIS — J301 Allergic rhinitis due to pollen: Secondary | ICD-10-CM | POA: Diagnosis not present

## 2020-11-21 DIAGNOSIS — J3089 Other allergic rhinitis: Secondary | ICD-10-CM | POA: Diagnosis not present

## 2020-11-26 DIAGNOSIS — J301 Allergic rhinitis due to pollen: Secondary | ICD-10-CM | POA: Diagnosis not present

## 2020-11-26 DIAGNOSIS — J3089 Other allergic rhinitis: Secondary | ICD-10-CM | POA: Diagnosis not present

## 2020-11-26 DIAGNOSIS — J3081 Allergic rhinitis due to animal (cat) (dog) hair and dander: Secondary | ICD-10-CM | POA: Diagnosis not present

## 2020-12-11 DIAGNOSIS — J301 Allergic rhinitis due to pollen: Secondary | ICD-10-CM | POA: Diagnosis not present

## 2020-12-11 DIAGNOSIS — J3089 Other allergic rhinitis: Secondary | ICD-10-CM | POA: Diagnosis not present

## 2020-12-19 DIAGNOSIS — J3089 Other allergic rhinitis: Secondary | ICD-10-CM | POA: Diagnosis not present

## 2020-12-19 DIAGNOSIS — J301 Allergic rhinitis due to pollen: Secondary | ICD-10-CM | POA: Diagnosis not present

## 2020-12-30 DIAGNOSIS — J3089 Other allergic rhinitis: Secondary | ICD-10-CM | POA: Diagnosis not present

## 2020-12-30 DIAGNOSIS — J301 Allergic rhinitis due to pollen: Secondary | ICD-10-CM | POA: Diagnosis not present

## 2021-01-09 DIAGNOSIS — J301 Allergic rhinitis due to pollen: Secondary | ICD-10-CM | POA: Diagnosis not present

## 2021-01-09 DIAGNOSIS — J3089 Other allergic rhinitis: Secondary | ICD-10-CM | POA: Diagnosis not present

## 2021-01-14 DIAGNOSIS — J3089 Other allergic rhinitis: Secondary | ICD-10-CM | POA: Diagnosis not present

## 2021-01-14 DIAGNOSIS — J301 Allergic rhinitis due to pollen: Secondary | ICD-10-CM | POA: Diagnosis not present

## 2021-01-22 DIAGNOSIS — J3089 Other allergic rhinitis: Secondary | ICD-10-CM | POA: Diagnosis not present

## 2021-01-22 DIAGNOSIS — J301 Allergic rhinitis due to pollen: Secondary | ICD-10-CM | POA: Diagnosis not present

## 2021-01-30 DIAGNOSIS — J3089 Other allergic rhinitis: Secondary | ICD-10-CM | POA: Diagnosis not present

## 2021-01-30 DIAGNOSIS — J3081 Allergic rhinitis due to animal (cat) (dog) hair and dander: Secondary | ICD-10-CM | POA: Diagnosis not present

## 2021-01-30 DIAGNOSIS — J301 Allergic rhinitis due to pollen: Secondary | ICD-10-CM | POA: Diagnosis not present

## 2021-02-10 DIAGNOSIS — J3089 Other allergic rhinitis: Secondary | ICD-10-CM | POA: Diagnosis not present

## 2021-02-10 DIAGNOSIS — J3081 Allergic rhinitis due to animal (cat) (dog) hair and dander: Secondary | ICD-10-CM | POA: Diagnosis not present

## 2021-02-10 DIAGNOSIS — J301 Allergic rhinitis due to pollen: Secondary | ICD-10-CM | POA: Diagnosis not present

## 2021-02-23 DIAGNOSIS — J301 Allergic rhinitis due to pollen: Secondary | ICD-10-CM | POA: Diagnosis not present

## 2021-02-23 DIAGNOSIS — J3089 Other allergic rhinitis: Secondary | ICD-10-CM | POA: Diagnosis not present

## 2021-03-02 DIAGNOSIS — J3089 Other allergic rhinitis: Secondary | ICD-10-CM | POA: Diagnosis not present

## 2021-03-02 DIAGNOSIS — J301 Allergic rhinitis due to pollen: Secondary | ICD-10-CM | POA: Diagnosis not present

## 2021-03-16 DIAGNOSIS — J3089 Other allergic rhinitis: Secondary | ICD-10-CM | POA: Diagnosis not present

## 2021-03-16 DIAGNOSIS — J301 Allergic rhinitis due to pollen: Secondary | ICD-10-CM | POA: Diagnosis not present

## 2021-04-09 DIAGNOSIS — J3089 Other allergic rhinitis: Secondary | ICD-10-CM | POA: Diagnosis not present

## 2021-04-09 DIAGNOSIS — J301 Allergic rhinitis due to pollen: Secondary | ICD-10-CM | POA: Diagnosis not present

## 2021-04-28 DIAGNOSIS — J3089 Other allergic rhinitis: Secondary | ICD-10-CM | POA: Diagnosis not present

## 2021-04-28 DIAGNOSIS — J301 Allergic rhinitis due to pollen: Secondary | ICD-10-CM | POA: Diagnosis not present

## 2021-05-06 DIAGNOSIS — J301 Allergic rhinitis due to pollen: Secondary | ICD-10-CM | POA: Diagnosis not present

## 2021-05-06 DIAGNOSIS — J3089 Other allergic rhinitis: Secondary | ICD-10-CM | POA: Diagnosis not present

## 2021-05-15 DIAGNOSIS — J301 Allergic rhinitis due to pollen: Secondary | ICD-10-CM | POA: Diagnosis not present

## 2021-05-15 DIAGNOSIS — J3089 Other allergic rhinitis: Secondary | ICD-10-CM | POA: Diagnosis not present

## 2021-05-21 ENCOUNTER — Encounter: Payer: Self-pay | Admitting: Family Medicine

## 2021-05-22 DIAGNOSIS — J3089 Other allergic rhinitis: Secondary | ICD-10-CM | POA: Diagnosis not present

## 2021-05-22 DIAGNOSIS — J301 Allergic rhinitis due to pollen: Secondary | ICD-10-CM | POA: Diagnosis not present

## 2021-05-27 DIAGNOSIS — J3089 Other allergic rhinitis: Secondary | ICD-10-CM | POA: Diagnosis not present

## 2021-05-27 DIAGNOSIS — J3081 Allergic rhinitis due to animal (cat) (dog) hair and dander: Secondary | ICD-10-CM | POA: Diagnosis not present

## 2021-05-27 DIAGNOSIS — J301 Allergic rhinitis due to pollen: Secondary | ICD-10-CM | POA: Diagnosis not present

## 2021-06-01 ENCOUNTER — Encounter: Payer: Self-pay | Admitting: Family Medicine

## 2021-06-01 ENCOUNTER — Ambulatory Visit: Payer: BC Managed Care – PPO | Admitting: Family Medicine

## 2021-06-01 ENCOUNTER — Other Ambulatory Visit: Payer: Self-pay

## 2021-06-01 VITALS — BP 134/86 | HR 85 | Temp 97.4°F | Wt 180.4 lb

## 2021-06-01 DIAGNOSIS — K640 First degree hemorrhoids: Secondary | ICD-10-CM | POA: Diagnosis not present

## 2021-06-01 NOTE — Progress Notes (Signed)
° °  Subjective:    Patient ID: Jon Cruz, male    DOB: July 26, 1970, 51 y.o.   MRN: [id number]  HPI He states that roughly 2 weeks ago he noticed some loose stools and after that noted some swelling and irritation in the anal area.  About a week ago he noted a lump that was sore.  Over the weekend the area became less sore but he still wants this checked out.   Review of Systems     Objective:   Physical Exam Annual exam does show a 1 and half centimeter hemorrhoid pleasant at 3 o'clock position.  The rest of the anal exam was normal.       Assessment & Plan:  Grade I hemorrhoids Discussed proper treatment of this with fluids, Balkan diet, exercise, warm tub baths and the possible use of Tucks.  Explained that if the pain gets much worse and this turns into a thrombosed hemorrhoid, it would need to be removed.  He expressed understanding of this.

## 2021-06-01 NOTE — Patient Instructions (Signed)
Keep yourself hydrated get some bulk in your diet which is fruits and vegetables and things like that.  Regular exercise can help and also when you have the urge listen to it. To help quiet down the direct some water on that while in the shower .  You can also try TUCKS

## 2021-06-08 DIAGNOSIS — J3089 Other allergic rhinitis: Secondary | ICD-10-CM | POA: Diagnosis not present

## 2021-06-12 DIAGNOSIS — J301 Allergic rhinitis due to pollen: Secondary | ICD-10-CM | POA: Diagnosis not present

## 2021-06-12 DIAGNOSIS — J3089 Other allergic rhinitis: Secondary | ICD-10-CM | POA: Diagnosis not present

## 2021-06-24 DIAGNOSIS — J301 Allergic rhinitis due to pollen: Secondary | ICD-10-CM | POA: Diagnosis not present

## 2021-06-24 DIAGNOSIS — J3089 Other allergic rhinitis: Secondary | ICD-10-CM | POA: Diagnosis not present

## 2021-07-02 DIAGNOSIS — J301 Allergic rhinitis due to pollen: Secondary | ICD-10-CM | POA: Diagnosis not present

## 2021-07-02 DIAGNOSIS — J3089 Other allergic rhinitis: Secondary | ICD-10-CM | POA: Diagnosis not present

## 2021-07-09 DIAGNOSIS — J301 Allergic rhinitis due to pollen: Secondary | ICD-10-CM | POA: Diagnosis not present

## 2021-07-09 DIAGNOSIS — J3089 Other allergic rhinitis: Secondary | ICD-10-CM | POA: Diagnosis not present

## 2021-07-15 DIAGNOSIS — J301 Allergic rhinitis due to pollen: Secondary | ICD-10-CM | POA: Diagnosis not present

## 2021-07-17 DIAGNOSIS — J3089 Other allergic rhinitis: Secondary | ICD-10-CM | POA: Diagnosis not present

## 2021-07-17 DIAGNOSIS — J301 Allergic rhinitis due to pollen: Secondary | ICD-10-CM | POA: Diagnosis not present

## 2021-07-23 DIAGNOSIS — J301 Allergic rhinitis due to pollen: Secondary | ICD-10-CM | POA: Diagnosis not present

## 2021-07-23 DIAGNOSIS — J3089 Other allergic rhinitis: Secondary | ICD-10-CM | POA: Diagnosis not present

## 2021-07-23 DIAGNOSIS — J3081 Allergic rhinitis due to animal (cat) (dog) hair and dander: Secondary | ICD-10-CM | POA: Diagnosis not present

## 2021-07-31 DIAGNOSIS — J3089 Other allergic rhinitis: Secondary | ICD-10-CM | POA: Diagnosis not present

## 2021-07-31 DIAGNOSIS — J3081 Allergic rhinitis due to animal (cat) (dog) hair and dander: Secondary | ICD-10-CM | POA: Diagnosis not present

## 2021-07-31 DIAGNOSIS — J301 Allergic rhinitis due to pollen: Secondary | ICD-10-CM | POA: Diagnosis not present

## 2021-08-07 DIAGNOSIS — J3089 Other allergic rhinitis: Secondary | ICD-10-CM | POA: Diagnosis not present

## 2021-08-07 DIAGNOSIS — J301 Allergic rhinitis due to pollen: Secondary | ICD-10-CM | POA: Diagnosis not present

## 2021-08-07 DIAGNOSIS — J3081 Allergic rhinitis due to animal (cat) (dog) hair and dander: Secondary | ICD-10-CM | POA: Diagnosis not present

## 2021-08-12 ENCOUNTER — Ambulatory Visit (INDEPENDENT_AMBULATORY_CARE_PROVIDER_SITE_OTHER): Payer: BC Managed Care – PPO | Admitting: Family Medicine

## 2021-08-12 VITALS — BP 128/88 | HR 75 | Temp 97.2°F | Ht 66.5 in | Wt 177.6 lb

## 2021-08-12 DIAGNOSIS — Z8719 Personal history of other diseases of the digestive system: Secondary | ICD-10-CM | POA: Diagnosis not present

## 2021-08-12 DIAGNOSIS — Z0000 Encounter for general adult medical examination without abnormal findings: Secondary | ICD-10-CM | POA: Diagnosis not present

## 2021-08-12 DIAGNOSIS — Z86010 Personal history of colonic polyps: Secondary | ICD-10-CM

## 2021-08-12 DIAGNOSIS — K5790 Diverticulosis of intestine, part unspecified, without perforation or abscess without bleeding: Secondary | ICD-10-CM

## 2021-08-12 DIAGNOSIS — J309 Allergic rhinitis, unspecified: Secondary | ICD-10-CM

## 2021-08-12 DIAGNOSIS — K219 Gastro-esophageal reflux disease without esophagitis: Secondary | ICD-10-CM | POA: Diagnosis not present

## 2021-08-12 DIAGNOSIS — I700 Atherosclerosis of aorta: Secondary | ICD-10-CM

## 2021-08-12 NOTE — Addendum Note (Signed)
Addended by: Denita Lung on: 08/12/2021 04:50 PM ? ? Modules accepted: Orders ? ?

## 2021-08-12 NOTE — Progress Notes (Signed)
? ?  Subjective:  ? ? Patient ID: Jon Cruz, male    DOB: Feb 01, 1971, 51 y.o.   MRN: [id number] ? ?HPI ?He is here for complete examination.  He has a previous history of diverticulitis and therefore also diverticulosis.  Follow-up colonoscopy did show colonic polyp.  There is a questionable family history of colon cancer however this is not true.  He does have underlying allergies and is getting desensitized.  He does have reflux disease and uses gel use he will and an occasional Pepcid with good result.  He does have x-ray evidence of aortic atherosclerosis.  His work and home life are going well.  He has two 63-year-old girls, fraternal twins.  Otherwise family and social history as well as health maintenance and immunizations was reviewed ? ? ?Review of Systems  ?All other systems reviewed and are negative. ? ?   ?Objective:  ? Physical Exam ?Alert and in no distress. Tympanic membranes and canals are normal. Pharyngeal area is normal. Neck is supple without adenopathy or thyromegaly. Cardiac exam shows a regular sinus rhythm without murmurs or gallops. Lungs are clear to auscultation.  Abdominal exam shows no masses or tenderness. ? ? ? ?   ?Assessment & Plan:  ?Routine general medical examination at a health care facility ? ?History of diverticulitis ? ?Aortic atherosclerosis ([place]) ? ?Gastroesophageal reflux disease without esophagitis ? ?Allergic rhinitis, mild ? ?Diverticulosis ? ?History of colonic polyps ?He will continue on his allergy medications and desensitization.  Continue to use Gelusil and Pepcid on an as-needed basis.  He will apparently return here in a couple weeks for his Shingrix shot. ? ?

## 2021-08-14 MED ORDER — ROSUVASTATIN CALCIUM 10 MG PO TABS: 10.0000 mg | ORAL_TABLET | Freq: Every day | ORAL | 3 refills | Status: DC

## 2021-08-14 NOTE — Addendum Note (Signed)
Addended by: Denita Lung on: 08/14/2021 11:37 AM ? ? Modules accepted: Orders ? ?

## 2021-08-17 LAB — COMPREHENSIVE METABOLIC PANEL
ALT: 39 IU/L (ref 0–44)
AST: 29 IU/L (ref 0–40)
Albumin/Globulin Ratio: 1.6 (ref 1.2–2.2)
Albumin: 4.4 g/dL (ref 4.0–5.0)
Alkaline Phosphatase: 97 IU/L (ref 44–121)
BUN/Creatinine Ratio: 15 (ref 9–20)
BUN: 15 mg/dL (ref 6–24)
Bilirubin Total: 0.3 mg/dL (ref 0.0–1.2)
CO2: 22 mmol/L (ref 20–29)
Calcium: 9 mg/dL (ref 8.7–10.2)
Chloride: 99 mmol/L (ref 96–106)
Creatinine, Ser: 0.99 mg/dL (ref 0.76–1.27)
Globulin, Total: 2.8 g/dL (ref 1.5–4.5)
Glucose: 86 mg/dL (ref 70–99)
Potassium: 4.2 mmol/L (ref 3.5–5.2)
Sodium: 137 mmol/L (ref 134–144)
Total Protein: 7.2 g/dL (ref 6.0–8.5)
eGFR: 93 mL/min/{1.73_m2} (ref >59)

## 2021-08-17 LAB — LIPID PANEL
Chol/HDL Ratio: 4.7 ratio (ref 0.0–5.0)
Cholesterol, Total: 203 mg/dL — ABNORMAL HIGH (ref 100–199)
HDL: 43 mg/dL (ref >39)
LDL Chol Calc (NIH): 127 mg/dL — ABNORMAL HIGH (ref 0–99)
Triglycerides: 185 mg/dL — ABNORMAL HIGH (ref 0–149)
VLDL Cholesterol Cal: 33 mg/dL (ref 5–40)

## 2021-08-17 LAB — CBC WITH DIFFERENTIAL/PLATELET
Basophils Absolute: 0.1 10*3/uL (ref 0.0–0.2)
Basos: 1 %
EOS (ABSOLUTE): 0.4 10*3/uL (ref 0.0–0.4)
Eos: 5 %
Hematocrit: 44.9 % (ref 37.5–51.0)
Hemoglobin: 14.8 g/dL (ref 13.0–17.7)
Immature Grans (Abs): 0 10*3/uL (ref 0.0–0.1)
Immature Granulocytes: 1 %
Lymphocytes Absolute: 1.9 10*3/uL (ref 0.7–3.1)
Lymphs: 24 %
MCH: 29.1 pg (ref 26.6–33.0)
MCHC: 33 g/dL (ref 31.5–35.7)
MCV: 88 fL (ref 79–97)
Monocytes Absolute: 0.7 10*3/uL (ref 0.1–0.9)
Monocytes: 9 %
Neutrophils Absolute: 4.9 10*3/uL (ref 1.4–7.0)
Neutrophils: 60 %
Platelets: 274 10*3/uL (ref 150–450)
RBC: 5.09 x10E6/uL (ref 4.14–5.80)
RDW: 12.8 % (ref 11.6–15.4)
WBC: 8.1 10*3/uL (ref 3.4–10.8)

## 2021-08-19 DIAGNOSIS — J3081 Allergic rhinitis due to animal (cat) (dog) hair and dander: Secondary | ICD-10-CM | POA: Diagnosis not present

## 2021-08-19 DIAGNOSIS — J3089 Other allergic rhinitis: Secondary | ICD-10-CM | POA: Diagnosis not present

## 2021-08-19 DIAGNOSIS — J301 Allergic rhinitis due to pollen: Secondary | ICD-10-CM | POA: Diagnosis not present

## 2021-08-21 ENCOUNTER — Other Ambulatory Visit: Payer: BC Managed Care – PPO

## 2021-08-25 DIAGNOSIS — J3089 Other allergic rhinitis: Secondary | ICD-10-CM | POA: Diagnosis not present

## 2021-08-25 DIAGNOSIS — J301 Allergic rhinitis due to pollen: Secondary | ICD-10-CM | POA: Diagnosis not present

## 2021-09-02 DIAGNOSIS — J301 Allergic rhinitis due to pollen: Secondary | ICD-10-CM | POA: Diagnosis not present

## 2021-09-02 DIAGNOSIS — J3089 Other allergic rhinitis: Secondary | ICD-10-CM | POA: Diagnosis not present

## 2021-09-02 DIAGNOSIS — J3081 Allergic rhinitis due to animal (cat) (dog) hair and dander: Secondary | ICD-10-CM | POA: Diagnosis not present

## 2021-09-09 DIAGNOSIS — J301 Allergic rhinitis due to pollen: Secondary | ICD-10-CM | POA: Diagnosis not present

## 2021-09-09 DIAGNOSIS — J3089 Other allergic rhinitis: Secondary | ICD-10-CM | POA: Diagnosis not present

## 2021-09-11 ENCOUNTER — Other Ambulatory Visit (INDEPENDENT_AMBULATORY_CARE_PROVIDER_SITE_OTHER): Payer: BC Managed Care – PPO

## 2021-09-11 DIAGNOSIS — Z23 Encounter for immunization: Secondary | ICD-10-CM

## 2021-09-25 DIAGNOSIS — J301 Allergic rhinitis due to pollen: Secondary | ICD-10-CM | POA: Diagnosis not present

## 2021-09-25 DIAGNOSIS — J3089 Other allergic rhinitis: Secondary | ICD-10-CM | POA: Diagnosis not present

## 2021-09-25 DIAGNOSIS — J3081 Allergic rhinitis due to animal (cat) (dog) hair and dander: Secondary | ICD-10-CM | POA: Diagnosis not present

## 2021-10-01 DIAGNOSIS — J301 Allergic rhinitis due to pollen: Secondary | ICD-10-CM | POA: Diagnosis not present

## 2021-10-01 DIAGNOSIS — L814 Other melanin hyperpigmentation: Secondary | ICD-10-CM | POA: Diagnosis not present

## 2021-10-01 DIAGNOSIS — L821 Other seborrheic keratosis: Secondary | ICD-10-CM | POA: Diagnosis not present

## 2021-10-01 DIAGNOSIS — C4441 Basal cell carcinoma of skin of scalp and neck: Secondary | ICD-10-CM | POA: Diagnosis not present

## 2021-10-01 DIAGNOSIS — D1801 Hemangioma of skin and subcutaneous tissue: Secondary | ICD-10-CM | POA: Diagnosis not present

## 2021-10-01 DIAGNOSIS — D225 Melanocytic nevi of trunk: Secondary | ICD-10-CM | POA: Diagnosis not present

## 2021-10-01 DIAGNOSIS — J3089 Other allergic rhinitis: Secondary | ICD-10-CM | POA: Diagnosis not present

## 2021-10-01 HISTORY — PX: SKIN BIOPSY: SHX1

## 2021-10-09 DIAGNOSIS — J301 Allergic rhinitis due to pollen: Secondary | ICD-10-CM | POA: Diagnosis not present

## 2021-10-09 DIAGNOSIS — J3081 Allergic rhinitis due to animal (cat) (dog) hair and dander: Secondary | ICD-10-CM | POA: Diagnosis not present

## 2021-10-09 DIAGNOSIS — J3089 Other allergic rhinitis: Secondary | ICD-10-CM | POA: Diagnosis not present

## 2021-10-14 DIAGNOSIS — J3089 Other allergic rhinitis: Secondary | ICD-10-CM | POA: Diagnosis not present

## 2021-10-14 DIAGNOSIS — C4441 Basal cell carcinoma of skin of scalp and neck: Secondary | ICD-10-CM | POA: Insufficient documentation

## 2021-10-14 DIAGNOSIS — J3081 Allergic rhinitis due to animal (cat) (dog) hair and dander: Secondary | ICD-10-CM | POA: Diagnosis not present

## 2021-10-14 DIAGNOSIS — J301 Allergic rhinitis due to pollen: Secondary | ICD-10-CM | POA: Diagnosis not present

## 2021-10-15 DIAGNOSIS — L570 Actinic keratosis: Secondary | ICD-10-CM | POA: Diagnosis not present

## 2021-10-15 DIAGNOSIS — C4441 Basal cell carcinoma of skin of scalp and neck: Secondary | ICD-10-CM | POA: Diagnosis not present

## 2021-10-16 ENCOUNTER — Encounter: Payer: Self-pay | Admitting: Family Medicine

## 2021-10-27 DIAGNOSIS — J301 Allergic rhinitis due to pollen: Secondary | ICD-10-CM | POA: Diagnosis not present

## 2021-10-27 DIAGNOSIS — J3089 Other allergic rhinitis: Secondary | ICD-10-CM | POA: Diagnosis not present

## 2021-10-27 DIAGNOSIS — J3081 Allergic rhinitis due to animal (cat) (dog) hair and dander: Secondary | ICD-10-CM | POA: Diagnosis not present

## 2021-11-13 DIAGNOSIS — J3089 Other allergic rhinitis: Secondary | ICD-10-CM | POA: Diagnosis not present

## 2021-11-13 DIAGNOSIS — J301 Allergic rhinitis due to pollen: Secondary | ICD-10-CM | POA: Diagnosis not present

## 2021-11-13 DIAGNOSIS — J3081 Allergic rhinitis due to animal (cat) (dog) hair and dander: Secondary | ICD-10-CM | POA: Diagnosis not present

## 2021-11-20 DIAGNOSIS — J3081 Allergic rhinitis due to animal (cat) (dog) hair and dander: Secondary | ICD-10-CM | POA: Diagnosis not present

## 2021-11-20 DIAGNOSIS — J3089 Other allergic rhinitis: Secondary | ICD-10-CM | POA: Diagnosis not present

## 2021-11-20 DIAGNOSIS — J301 Allergic rhinitis due to pollen: Secondary | ICD-10-CM | POA: Diagnosis not present

## 2021-11-23 ENCOUNTER — Ambulatory Visit: Payer: BC Managed Care – PPO | Admitting: Family Medicine

## 2021-11-23 VITALS — BP 118/80 | HR 72 | Temp 97.0°F | Wt 175.4 lb

## 2021-11-23 DIAGNOSIS — J309 Allergic rhinitis, unspecified: Secondary | ICD-10-CM

## 2021-11-23 DIAGNOSIS — J0100 Acute maxillary sinusitis, unspecified: Secondary | ICD-10-CM

## 2021-11-23 MED ORDER — AMOXICILLIN 875 MG PO TABS: 875.0000 mg | ORAL_TABLET | Freq: Two times a day (BID) | ORAL | 0 refills | Status: DC

## 2021-11-23 NOTE — Progress Notes (Signed)
   Subjective:    Patient ID: Jon Cruz, male    DOB: [birth year], 51 y.o.   MRN: [id number]  HPI He has a 4-day history of right ear congestion followed by right-sided sore throat and now having right ear ache as well as right maxillary sinus pressure and purulent postnasal drainage.  Upper teeth on the right also hurt.  He does have underlying allergies and presently is on Xyzal, Nasacort and is getting shots.  He does think the shots are helping him.  He does not smoke.   Review of Systems     Objective:   Physical Exam Alert and in no distress.  Nasal mucosa slightly erythematous.  Tender to palpation over the right maxillary sinus.  Tympanic membranes and canals are normal. Pharyngeal area is normal. Neck is supple without adenopathy or thyromegaly. Cardiac exam shows a regular sinus rhythm without murmurs or gallops. Lungs are clear to auscultation.        Assessment & Plan:  Acute non-recurrent maxillary sinusitis - Plan: amoxicillin (AMOXIL) 875 MG tablet  Allergic rhinitis, mild He will continue on his present medication regimen.  He is to call after he is finished the 10-day course and if not totally back to normal, I will give him more medication.  He was comfortable with that.

## 2021-11-27 DIAGNOSIS — J3089 Other allergic rhinitis: Secondary | ICD-10-CM | POA: Diagnosis not present

## 2021-11-27 DIAGNOSIS — J3081 Allergic rhinitis due to animal (cat) (dog) hair and dander: Secondary | ICD-10-CM | POA: Diagnosis not present

## 2021-11-27 DIAGNOSIS — J301 Allergic rhinitis due to pollen: Secondary | ICD-10-CM | POA: Diagnosis not present

## 2021-12-15 DIAGNOSIS — J301 Allergic rhinitis due to pollen: Secondary | ICD-10-CM | POA: Diagnosis not present

## 2021-12-15 DIAGNOSIS — J3089 Other allergic rhinitis: Secondary | ICD-10-CM | POA: Diagnosis not present

## 2021-12-15 DIAGNOSIS — J3081 Allergic rhinitis due to animal (cat) (dog) hair and dander: Secondary | ICD-10-CM | POA: Diagnosis not present

## 2021-12-22 DIAGNOSIS — J3089 Other allergic rhinitis: Secondary | ICD-10-CM | POA: Diagnosis not present

## 2021-12-24 DIAGNOSIS — H1045 Other chronic allergic conjunctivitis: Secondary | ICD-10-CM | POA: Diagnosis not present

## 2021-12-24 DIAGNOSIS — J301 Allergic rhinitis due to pollen: Secondary | ICD-10-CM | POA: Diagnosis not present

## 2021-12-24 DIAGNOSIS — J3081 Allergic rhinitis due to animal (cat) (dog) hair and dander: Secondary | ICD-10-CM | POA: Diagnosis not present

## 2021-12-24 DIAGNOSIS — J3089 Other allergic rhinitis: Secondary | ICD-10-CM | POA: Diagnosis not present

## 2021-12-29 DIAGNOSIS — C4441 Basal cell carcinoma of skin of scalp and neck: Secondary | ICD-10-CM | POA: Diagnosis not present

## 2021-12-30 DIAGNOSIS — J3081 Allergic rhinitis due to animal (cat) (dog) hair and dander: Secondary | ICD-10-CM | POA: Diagnosis not present

## 2021-12-30 DIAGNOSIS — J3089 Other allergic rhinitis: Secondary | ICD-10-CM | POA: Diagnosis not present

## 2021-12-30 DIAGNOSIS — J301 Allergic rhinitis due to pollen: Secondary | ICD-10-CM | POA: Diagnosis not present

## 2022-01-06 ENCOUNTER — Telehealth (INDEPENDENT_AMBULATORY_CARE_PROVIDER_SITE_OTHER): Payer: BC Managed Care – PPO | Admitting: Family Medicine

## 2022-01-06 ENCOUNTER — Encounter: Payer: Self-pay | Admitting: Family Medicine

## 2022-01-06 VITALS — Temp 96.8°F | Wt 175.0 lb

## 2022-01-06 DIAGNOSIS — J309 Allergic rhinitis, unspecified: Secondary | ICD-10-CM | POA: Diagnosis not present

## 2022-01-06 DIAGNOSIS — U071 COVID-19: Secondary | ICD-10-CM | POA: Diagnosis not present

## 2022-01-06 NOTE — Progress Notes (Signed)
   Subjective:    Patient ID: Jon Cruz, male    DOB: 06/07/1970, 51 y.o.   MRN: [id number]  HPI Documentation for virtual audio and video telecommunications through [place] encounter: The patient was located at home. 2 patient identifiers used.  The provider was located in the office. The patient did consent to this visit and is aware of possible charges through their insurance for this visit. The other persons participating in this telemedicine service were none. Time spent on call was 5 minutes and in review of previous records >15 minutes total for counseling and coordination of care. This virtual service is not related to other E/M service within previous 7 days.  He states that on Saturday he had some myalgias as well as some chest congestion.  On Monday he noted some diaphoresis with the above symptoms but no sore throat, earache or coughing.  He then tested and was positive for COVID.  Yesterday he did develop a slight cough with some sore throat.  He does have underlying allergies but is having no trouble with the allergies.  Review of Systems     Objective:   Physical Exam Alert and in no distress.  Breathing pattern appears normal.  He does not look toxic.       Assessment & Plan:  Allergic rhinitis, mild  COVID-19 Recommend he continue with Tylenol and then use Robitussin-DM during the day and NyQuil at night.  He can also use Afrin to help with his nasal congestion.  Discussed fever, worsening cough and shortness of breath as problems that would need to be addressed.  Discussed sequestration for 5 days and then using a mask for another 5 days.  He expressed understanding of this and will call if he has trouble.

## 2022-01-12 ENCOUNTER — Telehealth: Payer: Self-pay | Admitting: Family Medicine

## 2022-01-12 MED ORDER — AMOXICILLIN-POT CLAVULANATE 875-125 MG PO TABS
1.0000 | ORAL_TABLET | Freq: Two times a day (BID) | ORAL | 0 refills | Status: DC
Start: 2022-01-12 — End: 2022-05-21

## 2022-01-12 NOTE — Telephone Encounter (Signed)
Pt called and states that he is having right side pressure and pain under the eye and is right, and is above the teeth area, and Is having a lot of congestion and some ringing in the ear,  Pt thinks he now has a sinus infection  He wants to know if you will send him something in Pt uses [id number]- [place], [place] - [address]

## 2022-01-13 ENCOUNTER — Encounter: Payer: Self-pay | Admitting: Internal Medicine

## 2022-01-20 DIAGNOSIS — J3089 Other allergic rhinitis: Secondary | ICD-10-CM | POA: Diagnosis not present

## 2022-01-20 DIAGNOSIS — J301 Allergic rhinitis due to pollen: Secondary | ICD-10-CM | POA: Diagnosis not present

## 2022-02-01 DIAGNOSIS — J3089 Other allergic rhinitis: Secondary | ICD-10-CM | POA: Diagnosis not present

## 2022-02-01 DIAGNOSIS — J3081 Allergic rhinitis due to animal (cat) (dog) hair and dander: Secondary | ICD-10-CM | POA: Diagnosis not present

## 2022-02-01 DIAGNOSIS — J301 Allergic rhinitis due to pollen: Secondary | ICD-10-CM | POA: Diagnosis not present

## 2022-02-16 ENCOUNTER — Encounter: Payer: Self-pay | Admitting: Internal Medicine

## 2022-02-16 DIAGNOSIS — J3089 Other allergic rhinitis: Secondary | ICD-10-CM | POA: Diagnosis not present

## 2022-02-16 DIAGNOSIS — J301 Allergic rhinitis due to pollen: Secondary | ICD-10-CM | POA: Diagnosis not present

## 2022-02-16 DIAGNOSIS — J3081 Allergic rhinitis due to animal (cat) (dog) hair and dander: Secondary | ICD-10-CM | POA: Diagnosis not present

## 2022-02-25 DIAGNOSIS — J301 Allergic rhinitis due to pollen: Secondary | ICD-10-CM | POA: Diagnosis not present

## 2022-02-25 DIAGNOSIS — J3089 Other allergic rhinitis: Secondary | ICD-10-CM | POA: Diagnosis not present

## 2022-03-01 ENCOUNTER — Encounter: Payer: Self-pay | Admitting: Internal Medicine

## 2022-03-11 DIAGNOSIS — J301 Allergic rhinitis due to pollen: Secondary | ICD-10-CM | POA: Diagnosis not present

## 2022-03-11 DIAGNOSIS — J3081 Allergic rhinitis due to animal (cat) (dog) hair and dander: Secondary | ICD-10-CM | POA: Diagnosis not present

## 2022-03-11 DIAGNOSIS — J3089 Other allergic rhinitis: Secondary | ICD-10-CM | POA: Diagnosis not present

## 2022-03-25 DIAGNOSIS — J3081 Allergic rhinitis due to animal (cat) (dog) hair and dander: Secondary | ICD-10-CM | POA: Diagnosis not present

## 2022-03-25 DIAGNOSIS — J301 Allergic rhinitis due to pollen: Secondary | ICD-10-CM | POA: Diagnosis not present

## 2022-03-25 DIAGNOSIS — J3089 Other allergic rhinitis: Secondary | ICD-10-CM | POA: Diagnosis not present

## 2022-03-31 DIAGNOSIS — J3081 Allergic rhinitis due to animal (cat) (dog) hair and dander: Secondary | ICD-10-CM | POA: Diagnosis not present

## 2022-03-31 DIAGNOSIS — J3089 Other allergic rhinitis: Secondary | ICD-10-CM | POA: Diagnosis not present

## 2022-03-31 DIAGNOSIS — J301 Allergic rhinitis due to pollen: Secondary | ICD-10-CM | POA: Diagnosis not present

## 2022-04-09 DIAGNOSIS — J3081 Allergic rhinitis due to animal (cat) (dog) hair and dander: Secondary | ICD-10-CM | POA: Diagnosis not present

## 2022-04-09 DIAGNOSIS — J3089 Other allergic rhinitis: Secondary | ICD-10-CM | POA: Diagnosis not present

## 2022-04-09 DIAGNOSIS — J301 Allergic rhinitis due to pollen: Secondary | ICD-10-CM | POA: Diagnosis not present

## 2022-04-16 DIAGNOSIS — J3081 Allergic rhinitis due to animal (cat) (dog) hair and dander: Secondary | ICD-10-CM | POA: Diagnosis not present

## 2022-04-16 DIAGNOSIS — J301 Allergic rhinitis due to pollen: Secondary | ICD-10-CM | POA: Diagnosis not present

## 2022-04-16 DIAGNOSIS — J3089 Other allergic rhinitis: Secondary | ICD-10-CM | POA: Diagnosis not present

## 2022-04-27 DIAGNOSIS — J301 Allergic rhinitis due to pollen: Secondary | ICD-10-CM | POA: Diagnosis not present

## 2022-04-27 DIAGNOSIS — J3089 Other allergic rhinitis: Secondary | ICD-10-CM | POA: Diagnosis not present

## 2022-04-27 DIAGNOSIS — J3081 Allergic rhinitis due to animal (cat) (dog) hair and dander: Secondary | ICD-10-CM | POA: Diagnosis not present

## 2022-05-21 ENCOUNTER — Encounter: Payer: Self-pay | Admitting: Medical

## 2022-05-21 ENCOUNTER — Other Ambulatory Visit: Payer: Self-pay | Admitting: Medical

## 2022-05-21 ENCOUNTER — Ambulatory Visit: Payer: BC Managed Care – PPO | Admitting: Medical

## 2022-05-21 VITALS — BP 132/80 | HR 76 | Temp 96.7°F | Ht 66.5 in | Wt 184.0 lb

## 2022-05-21 DIAGNOSIS — K5790 Diverticulosis of intestine, part unspecified, without perforation or abscess without bleeding: Secondary | ICD-10-CM | POA: Diagnosis not present

## 2022-05-21 DIAGNOSIS — B001 Herpesviral vesicular dermatitis: Secondary | ICD-10-CM

## 2022-05-21 DIAGNOSIS — Z7185 Encounter for immunization safety counseling: Secondary | ICD-10-CM | POA: Diagnosis not present

## 2022-05-21 MED ORDER — ACYCLOVIR 5 % EX OINT: TOPICAL_OINTMENT | CUTANEOUS | 0 refills | Status: DC

## 2022-05-21 MED ORDER — ACYCLOVIR 5 % EX CREA: 1.0000 | TOPICAL_CREAM | CUTANEOUS | 0 refills | Status: AC

## 2022-05-21 MED ORDER — VALACYCLOVIR HCL 1 G PO TABS: 1000.0000 mg | ORAL_TABLET | Freq: Two times a day (BID) | ORAL | 1 refills | Status: AC

## 2022-05-21 NOTE — Progress Notes (Signed)
Subjective:  Jon Cruz is a 52 y.o. male who presents for Chief Complaint  Patient presents with   Blister    Fever blister started Saturday, does not have any oral meds that he uses. Did try Abreva and CamphoPhenique. Will not go heal up. He mentions that he had a flu shot last Thursday and thought maybe that could be why he got a cold sore (has had in the past, gets about 1-2 yearly).      Here for concern about fever blisters.  He has a history of getting cold sores a couple times per year.  Has had over-the-counter medication.  Curious about prescription medication.  Usually Abreva or other OTC medications help but this one isn't drying up as quick.  No other aggravating or relieving factors.    Thinks he is due back for colonoscopy given hx/o diverticular disease  Thinks he is due for Shingrix  No other c/o  Past Medical History:  Diagnosis Date   Abnormal findings on diagnostic imaging of liver 02/21/2017   Incidental finding on CT- multiple subcentimeter low attenuation lesions, likely cysts per radiologist   Allergy    Aortic atherosclerosis ([place])    on CT   Diverticulitis    Elevated ALT measurement    GERD (gastroesophageal reflux disease)    takes otc meds   Current Outpatient Medications on File Prior to Visit  Medication Sig Dispense Refill   Multiple Vitamin (MULTIVITAMIN) capsule Take 1 capsule by mouth daily.     Omega-3 Fatty Acids (FISH OIL PO) Take 1 capsule by mouth.     Probiotic Product (PRO-BIOTIC BLEND PO) Take by mouth.     rosuvastatin (CRESTOR) 10 MG tablet Take 1 tablet (10 mg total) by mouth daily. 90 tablet 3   Wheat Dextrin (CLEAR SOLUBLE FIBER PO) Take 3.2 g by mouth.     acetaminophen (TYLENOL) 500 MG tablet Take 500 mg by mouth every 6 (six) hours as needed. (Patient not taking: Reported on 05/21/2022)     alum & mag hydroxide-simeth (GELUSIL) 200-200-25 MG chewable tablet 1 tablet as needed (Patient not taking: Reported on 05/21/2022)      EPINEPHrine 0.3 mg/0.3 mL IJ SOAJ injection 1 injection (Patient not taking: Reported on 01/06/2022)     famotidine (PEPCID) 10 MG tablet Take 10 mg by mouth 2 (two) times daily. (Patient not taking: Reported on 05/21/2022)     levocetirizine (XYZAL) 5 MG tablet 1 tablet in the evening (Patient not taking: Reported on 05/21/2022)     Olopatadine HCl 0.2 % SOLN 1 drop into affected eye (Patient not taking: Reported on 11/23/2021)     triamcinolone (NASACORT) 55 MCG/ACT AERO nasal inhaler Place 1 spray into the nose 2 (two) times daily. (Patient not taking: Reported on 05/21/2022) 1 Inhaler 12   No current facility-administered medications on file prior to visit.     The following portions of the patient's history were reviewed and updated as appropriate: allergies, current medications, past family history, past medical history, past social history, past surgical history and problem list.  ROS Otherwise as in subjective above    Objective: BP 132/80   Pulse 76   Temp (!) 96.7 F (35.9 C) (Tympanic)   Ht 5' 6.5" (1.689 m)   Wt 184 lb (83.5 kg)   BMI 29.25 kg/m   General appearance: alert, no distress, well developed, well nourished HEENT: normocephalic, sclerae anicteric, conjunctiva pink and moist, normal pharynx Oral cavity: MMM, left upper lip with  small patch of erythema and crusting c/w cold sore, no other lesions    Assessment: Encounter Diagnoses  Name Primary?   Diverticulosis Yes   Cold sore    Vaccine counseling      Plan: Cold sores Begin topical acyclovir at his request for topical therapy.  Discussed benefits of oral acute therapy.   Discussed risks/benefits of medication.  Uses valtrex prn as soon as symptoms begin  A cold sore, also called a fever blister, is a skin infection that is caused by a virus. This infection causes small, fluid-filled sores to form inside of the mouth or on the lips, gums, nose, chin, or cheeks. Cold sores can spread to other parts of the  body, such as the eyes or fingers.  Cold sores can be spread or passed from person to person (contagious) until the sores crust over completely. Cold sores can be spread through close contact, such as kissing or sharing a drinking glass.  Sore Care  Do not touch the sores or pick the scabs. Wash your hands often. Do not touch your eyes without washing your hands first. Keep the sores clean and dry. If directed, apply ice to the sores: Put ice in a plastic bag. Place a towel between your skin and the bag. Leave the ice on for 20 minutes, 2-3 times per day. Lifestyle  Do not kiss, have oral sex, or share personal items until your sores heal. Eat a soft, bland diet. Avoid eating hot, cold, or salty foods. These can hurt your mouth. Use a straw if it hurts to drink out of a glass. Avoid the sun and limit your stress if these things trigger outbreaks. If sun causes cold sores, apply sunscreen on your lips before being out in the sun. Contact a doctor if: You have symptoms for more than two weeks. You have pus coming from the sores. You have redness that is spreading. You have pain or irritation in your eye. You get sores on your genitals. Your sores do not heal within two weeks. You get cold sores often.  Diverticulitis - referral back to GI for updated colonoscopy  Vaccine counseling - return at his convenience for 2nd Shingrix  Jon Cruz was seen today for blister.  Diagnoses and all orders for this visit:  Diverticulosis -     Ambulatory referral to Gastroenterology  Cold sore  Vaccine counseling  Other orders -     valACYclovir (VALTREX) 1000 MG tablet; Take 1 tablet (1,000 mg total) by mouth 2 (two) times daily. -     acyclovir cream (ZOVIRAX) 5 %; Apply 1 Application topically every 4 (four) hours.   Follow up: prn

## 2022-05-24 DIAGNOSIS — J301 Allergic rhinitis due to pollen: Secondary | ICD-10-CM | POA: Diagnosis not present

## 2022-05-24 DIAGNOSIS — J3081 Allergic rhinitis due to animal (cat) (dog) hair and dander: Secondary | ICD-10-CM | POA: Diagnosis not present

## 2022-05-24 DIAGNOSIS — J3089 Other allergic rhinitis: Secondary | ICD-10-CM | POA: Diagnosis not present

## 2022-06-02 DIAGNOSIS — J3089 Other allergic rhinitis: Secondary | ICD-10-CM | POA: Diagnosis not present

## 2022-06-11 DIAGNOSIS — J3089 Other allergic rhinitis: Secondary | ICD-10-CM | POA: Diagnosis not present

## 2022-06-11 DIAGNOSIS — J301 Allergic rhinitis due to pollen: Secondary | ICD-10-CM | POA: Diagnosis not present

## 2022-06-22 DIAGNOSIS — J301 Allergic rhinitis due to pollen: Secondary | ICD-10-CM | POA: Diagnosis not present

## 2022-06-22 DIAGNOSIS — J3089 Other allergic rhinitis: Secondary | ICD-10-CM | POA: Diagnosis not present

## 2022-06-28 DIAGNOSIS — J3089 Other allergic rhinitis: Secondary | ICD-10-CM | POA: Diagnosis not present

## 2022-06-28 DIAGNOSIS — J301 Allergic rhinitis due to pollen: Secondary | ICD-10-CM | POA: Diagnosis not present

## 2022-06-28 DIAGNOSIS — J3081 Allergic rhinitis due to animal (cat) (dog) hair and dander: Secondary | ICD-10-CM | POA: Diagnosis not present

## 2022-07-06 DIAGNOSIS — J3081 Allergic rhinitis due to animal (cat) (dog) hair and dander: Secondary | ICD-10-CM | POA: Diagnosis not present

## 2022-07-06 DIAGNOSIS — J301 Allergic rhinitis due to pollen: Secondary | ICD-10-CM | POA: Diagnosis not present

## 2022-07-06 DIAGNOSIS — J3089 Other allergic rhinitis: Secondary | ICD-10-CM | POA: Diagnosis not present

## 2022-07-15 DIAGNOSIS — J301 Allergic rhinitis due to pollen: Secondary | ICD-10-CM | POA: Diagnosis not present

## 2022-07-15 DIAGNOSIS — J3089 Other allergic rhinitis: Secondary | ICD-10-CM | POA: Diagnosis not present

## 2022-07-15 DIAGNOSIS — J3081 Allergic rhinitis due to animal (cat) (dog) hair and dander: Secondary | ICD-10-CM | POA: Diagnosis not present

## 2022-07-22 DIAGNOSIS — J3089 Other allergic rhinitis: Secondary | ICD-10-CM | POA: Diagnosis not present

## 2022-07-22 DIAGNOSIS — J3081 Allergic rhinitis due to animal (cat) (dog) hair and dander: Secondary | ICD-10-CM | POA: Diagnosis not present

## 2022-07-22 DIAGNOSIS — J301 Allergic rhinitis due to pollen: Secondary | ICD-10-CM | POA: Diagnosis not present

## 2022-07-30 DIAGNOSIS — J3089 Other allergic rhinitis: Secondary | ICD-10-CM | POA: Diagnosis not present

## 2022-07-30 DIAGNOSIS — J3081 Allergic rhinitis due to animal (cat) (dog) hair and dander: Secondary | ICD-10-CM | POA: Diagnosis not present

## 2022-07-30 DIAGNOSIS — J301 Allergic rhinitis due to pollen: Secondary | ICD-10-CM | POA: Diagnosis not present

## 2022-08-04 ENCOUNTER — Encounter: Payer: Self-pay | Admitting: Medical

## 2022-08-04 DIAGNOSIS — J301 Allergic rhinitis due to pollen: Secondary | ICD-10-CM | POA: Diagnosis not present

## 2022-08-04 DIAGNOSIS — J3081 Allergic rhinitis due to animal (cat) (dog) hair and dander: Secondary | ICD-10-CM | POA: Diagnosis not present

## 2022-08-04 DIAGNOSIS — J3089 Other allergic rhinitis: Secondary | ICD-10-CM | POA: Diagnosis not present

## 2022-08-11 DIAGNOSIS — J301 Allergic rhinitis due to pollen: Secondary | ICD-10-CM | POA: Diagnosis not present

## 2022-08-11 DIAGNOSIS — J3089 Other allergic rhinitis: Secondary | ICD-10-CM | POA: Diagnosis not present

## 2022-08-11 DIAGNOSIS — J3081 Allergic rhinitis due to animal (cat) (dog) hair and dander: Secondary | ICD-10-CM | POA: Diagnosis not present

## 2022-08-17 ENCOUNTER — Encounter: Payer: Self-pay | Admitting: Gastroenterology

## 2022-08-19 ENCOUNTER — Encounter: Payer: BC Managed Care – PPO | Admitting: Family Medicine

## 2022-08-26 DIAGNOSIS — J301 Allergic rhinitis due to pollen: Secondary | ICD-10-CM | POA: Diagnosis not present

## 2022-08-26 DIAGNOSIS — J3081 Allergic rhinitis due to animal (cat) (dog) hair and dander: Secondary | ICD-10-CM | POA: Diagnosis not present

## 2022-08-26 DIAGNOSIS — J3089 Other allergic rhinitis: Secondary | ICD-10-CM | POA: Diagnosis not present

## 2022-09-03 DIAGNOSIS — J301 Allergic rhinitis due to pollen: Secondary | ICD-10-CM | POA: Diagnosis not present

## 2022-09-03 DIAGNOSIS — J3089 Other allergic rhinitis: Secondary | ICD-10-CM | POA: Diagnosis not present

## 2022-09-03 DIAGNOSIS — J3081 Allergic rhinitis due to animal (cat) (dog) hair and dander: Secondary | ICD-10-CM | POA: Diagnosis not present

## 2022-09-20 DIAGNOSIS — J3081 Allergic rhinitis due to animal (cat) (dog) hair and dander: Secondary | ICD-10-CM | POA: Diagnosis not present

## 2022-09-20 DIAGNOSIS — J3089 Other allergic rhinitis: Secondary | ICD-10-CM | POA: Diagnosis not present

## 2022-09-20 DIAGNOSIS — J301 Allergic rhinitis due to pollen: Secondary | ICD-10-CM | POA: Diagnosis not present

## 2022-09-24 ENCOUNTER — Encounter: Payer: Self-pay | Admitting: Gastroenterology

## 2022-09-24 ENCOUNTER — Ambulatory Visit (AMBULATORY_SURGERY_CENTER): Payer: BC Managed Care – PPO

## 2022-09-24 VITALS — Ht 68.0 in | Wt 175.0 lb

## 2022-09-24 DIAGNOSIS — Z800 Family history of malignant neoplasm of digestive organs: Secondary | ICD-10-CM

## 2022-09-24 DIAGNOSIS — J3089 Other allergic rhinitis: Secondary | ICD-10-CM | POA: Diagnosis not present

## 2022-09-24 MED ORDER — NA SULFATE-K SULFATE-MG SULF 17.5-3.13-1.6 GM/177ML PO SOLN: 1.0000 | Freq: Once | ORAL | 0 refills | Status: AC

## 2022-09-24 NOTE — Progress Notes (Signed)
Pre visit completed via phone call; Patient verified name, DOB, and address;  No egg or soy allergy known to patient;  No issues known to pt with past sedation with any surgeries or procedures; Patient denies ever being told they had issues or difficulty with intubation;  No FH of Malignant Hyperthermia; Pt is not on diet pills; Pt is not on home 02;  Pt is not on blood thinners;  Pt denies issues with constipation  No A fib or A flutter; Have any cardiac testing pending--NO Pt instructed to use [email or web address] or GoodRx for a price reduction on prep   Insurance verified during PV appt=BCBS  Patient's chart reviewed by Cathlyn Parsons CNRA prior to previsit and patient appropriate for the [facility].  Previsit completed and red dot placed by patient's name on their procedure day (on provider's schedule).    Instructions sent to patient's MyChart per his request; GoodRx coupon information sent via Rx;

## 2022-10-08 DIAGNOSIS — J3081 Allergic rhinitis due to animal (cat) (dog) hair and dander: Secondary | ICD-10-CM | POA: Diagnosis not present

## 2022-10-08 DIAGNOSIS — J301 Allergic rhinitis due to pollen: Secondary | ICD-10-CM | POA: Diagnosis not present

## 2022-10-08 DIAGNOSIS — J3089 Other allergic rhinitis: Secondary | ICD-10-CM | POA: Diagnosis not present

## 2022-10-20 ENCOUNTER — Encounter: Payer: Self-pay | Admitting: Gastroenterology

## 2022-10-20 ENCOUNTER — Ambulatory Visit: Payer: BC Managed Care – PPO | Admitting: Gastroenterology

## 2022-10-20 VITALS — BP 116/64 | HR 71 | Temp 98.0°F | Resp 12 | Ht 68.0 in | Wt 175.0 lb

## 2022-10-20 DIAGNOSIS — Z800 Family history of malignant neoplasm of digestive organs: Secondary | ICD-10-CM

## 2022-10-20 DIAGNOSIS — Z1211 Encounter for screening for malignant neoplasm of colon: Secondary | ICD-10-CM

## 2022-10-20 MED ORDER — SODIUM CHLORIDE 0.9 % IV SOLN: 500.0000 mL | Freq: Once | INTRAVENOUS | Status: DC

## 2022-10-20 NOTE — Progress Notes (Signed)
Uneventful anesthetic. Report to pacu rn. Vss. Care resumed by rn. 

## 2022-10-20 NOTE — Patient Instructions (Signed)
Please read handouts provided. Continue present medications. Repeat colonoscopy in 5 years for screening.   YOU HAD AN ENDOSCOPIC PROCEDURE TODAY AT THE [facility]:   Refer to the procedure report that was given to you for any specific questions about what was found during the examination.  If the procedure report does not answer your questions, please call your gastroenterologist to clarify.  If you requested that your care partner not be given the details of your procedure findings, then the procedure report has been included in a sealed envelope for you to review at your convenience later.  YOU SHOULD EXPECT: Some feelings of bloating in the abdomen. Passage of more gas than usual.  Walking can help get rid of the air that was put into your GI tract during the procedure and reduce the bloating. If you had a lower endoscopy (such as a colonoscopy or flexible sigmoidoscopy) you may notice spotting of blood in your stool or on the toilet paper. If you underwent a bowel prep for your procedure, you may not have a normal bowel movement for a few days.  Please Note:  You might notice some irritation and congestion in your nose or some drainage.  This is from the oxygen used during your procedure.  There is no need for concern and it should clear up in a day or so.  SYMPTOMS TO REPORT IMMEDIATELY:  Following lower endoscopy (colonoscopy or flexible sigmoidoscopy):  Excessive amounts of blood in the stool  Significant tenderness or worsening of abdominal pains  Swelling of the abdomen that is new, acute  Fever of 100F or higher  For urgent or emergent issues, a gastroenterologist can be reached at any hour by calling [phone]. Do not use MyChart messaging for urgent concerns.    DIET:  We do recommend a small meal at first, but then you may proceed to your regular diet.  Drink plenty of fluids but you should avoid alcoholic beverages for 24 hours.  ACTIVITY:  You should plan  to take it easy for the rest of today and you should NOT DRIVE or use heavy machinery until tomorrow (because of the sedation medicines used during the test).    FOLLOW UP: Our staff will call the number listed on your records the next business day following your procedure.  We will call around 7:15- 8:00 am to check on you and address any questions or concerns that you may have regarding the information given to you following your procedure. If we do not reach you, we will leave a message.     If any biopsies were taken you will be contacted by phone or by letter within the next 1-3 weeks.  Please call us at [phone] if you have not heard about the biopsies in 3 weeks.    SIGNATURES/CONFIDENTIALITY: You and/or your care partner have signed paperwork which will be entered into your electronic medical record.  These signatures attest to the fact that that the information above on your After Visit Summary has been reviewed and is understood.  Full responsibility of the confidentiality of this discharge information lies with you and/or your care-partner. 

## 2022-10-20 NOTE — Progress Notes (Signed)
Pt's states no medical or surgical changes since previsit or office visit. 

## 2022-10-20 NOTE — Progress Notes (Signed)
[facility] History and Physical   Primary Care Physician:  Ronnald Nian, MD   Reason for Procedure:  Family history of colon cancer  Plan:    Screening colonoscopy with possible interventions as needed     HPI: Jon Cruz is a very pleasant 52 y.o. male here for screening colonoscopy. Denies any nausea, vomiting, abdominal pain, melena or bright red blood per rectum  The risks and benefits as well as alternatives of endoscopic procedure(s) have been discussed and reviewed. All questions answered. The patient agrees to proceed.    Past Medical History:  Diagnosis Date   Abnormal findings on diagnostic imaging of liver 02/21/2017   Incidental finding on CT- multiple subcentimeter low attenuation lesions, likely cysts per radiologist   Aortic atherosclerosis (HCC)    on CT   Diverticulitis 2019   Elevated ALT measurement    GERD (gastroesophageal reflux disease)    takes otc meds   Hyperlipidemia    on meds   Seasonal allergies     Past Surgical History:  Procedure Laterality Date   APPENDECTOMY  1988   COLONOSCOPY  2019   KN-MAC-suprep(exc)-tics/int hems   INGUINAL HERNIA REPAIR Right 2008   SKIN BIOPSY Left 10/01/2021   basel cell carcinoma    Prior to Admission medications   Medication Sig Start Date End Date Taking? Authorizing Provider  acetaminophen (TYLENOL) 500 MG tablet Take 500 mg by mouth every 6 (six) hours as needed.   Yes [provider]  alum & mag hydroxide-simeth (GELUSIL) 200-200-25 MG chewable tablet Chew 1 tablet by mouth daily as needed for indigestion, heartburn or flatulence.   Yes [provider]  famotidine (PEPCID) 10 MG tablet Take 10 mg by mouth daily as needed for heartburn or indigestion.   Yes [provider]  levocetirizine (XYZAL) 5 MG tablet Take 5 mg by mouth every evening.   Yes [provider]  Multiple Vitamin (MULTIVITAMIN) capsule Take 1 capsule by mouth daily.   Yes  [provider]  Probiotic Product (PROBIOTIC PO) Take 1 capsule by mouth daily at 6 (six) AM.   Yes [provider]  rosuvastatin (CRESTOR) 10 MG tablet Take 1 tablet (10 mg total) by mouth daily. 08/14/21  Yes Ronnald Nian, MD  acyclovir cream (ZOVIRAX) 5 % Apply 1 Application topically every 4 (four) hours. Patient not taking: Reported on 09/24/2022 05/21/22   Tysinger, Kermit Balo, PA-C  EPINEPHrine 0.3 mg/0.3 mL IJ SOAJ injection 1 injection Patient not taking: Reported on 09/24/2022    [provider]  triamcinolone (NASACORT) 55 MCG/ACT AERO nasal inhaler Place 1 spray into the nose 2 (two) times daily. Patient taking differently: Place 1 spray into the nose daily as needed. 07/20/17   Lezlie Lye, Meda Coffee, MD  valACYclovir (VALTREX) 1000 MG tablet Take 1 tablet (1,000 mg total) by mouth 2 (two) times daily. Patient taking differently: Take 1,000 mg by mouth 2 (two) times daily as needed (fever blisters). 05/21/22   Tysinger, Kermit Balo, PA-C  Wheat Dextrin (BENEFIBER PO) Take 1 Dose by mouth daily at 6 (six) AM.    [provider]    Current Outpatient Medications  Medication Sig Dispense Refill   acetaminophen (TYLENOL) 500 MG tablet Take 500 mg by mouth every 6 (six) hours as needed.     alum & mag hydroxide-simeth (GELUSIL) 200-200-25 MG chewable tablet Chew 1 tablet by mouth daily as needed for indigestion, heartburn or flatulence.     famotidine (PEPCID)  10 MG tablet Take 10 mg by mouth daily as needed for heartburn or indigestion.     levocetirizine (XYZAL) 5 MG tablet Take 5 mg by mouth every evening.     Multiple Vitamin (MULTIVITAMIN) capsule Take 1 capsule by mouth daily.     Probiotic Product (PROBIOTIC PO) Take 1 capsule by mouth daily at 6 (six) AM.     rosuvastatin (CRESTOR) 10 MG tablet Take 1 tablet (10 mg total) by mouth daily. 90 tablet 3   acyclovir cream (ZOVIRAX) 5 % Apply 1 Application topically every 4 (four) hours. (Patient not taking:  Reported on 09/24/2022) 15 g 0   EPINEPHrine 0.3 mg/0.3 mL IJ SOAJ injection 1 injection (Patient not taking: Reported on 09/24/2022)     triamcinolone (NASACORT) 55 MCG/ACT AERO nasal inhaler Place 1 spray into the nose 2 (two) times daily. (Patient taking differently: Place 1 spray into the nose daily as needed.) 1 Inhaler 12   valACYclovir (VALTREX) 1000 MG tablet Take 1 tablet (1,000 mg total) by mouth 2 (two) times daily. (Patient taking differently: Take 1,000 mg by mouth 2 (two) times daily as needed (fever blisters).) 40 tablet 1   Wheat Dextrin (BENEFIBER PO) Take 1 Dose by mouth daily at 6 (six) AM.     Current Facility-Administered Medications  Medication Dose Route Frequency Provider Last Rate Last Admin   0.9 %  sodium chloride infusion  500 mL Intravenous Once Napoleon Form, MD        Allergies as of 10/20/2022   (No Known Allergies)    Family History  Problem Relation Age of Onset   Colon polyps Mother 61   Hypertension Mother    Colon polyps Father 16   Hypertension Father    Colon polyps Sister 16   Breast cancer Maternal Aunt    Colon cancer Maternal Aunt    Cancer Maternal Grandfather    Cancer Paternal Grandmother    Cancer Paternal Grandfather    Esophageal cancer Neg Hx    Stomach cancer Neg Hx    Rectal cancer Neg Hx     Social History   Socioeconomic History   Marital status: Married    Spouse name: Not on file   Number of children: 2   Years of education: Not on file   Highest education level: Not on file  Occupational History   Not on file  Tobacco Use   Smoking status: Never   Smokeless tobacco: Never  Vaping Use   Vaping Use: Never used  Substance and Sexual Activity   Alcohol use: Yes    Alcohol/week: 0.0 - 2.0 standard drinks of alcohol    Comment: rare   Drug use: No   Sexual activity: Yes  Other Topics Concern   Not on file  Social History Narrative   Not on file   Social Determinants of Health   Financial Resource  Strain: Not on file  Food Insecurity: Not on file  Transportation Needs: Not on file  Physical Activity: Not on file  Stress: Not on file  Social Connections: Not on file  Intimate Partner Violence: Not on file    Review of Systems:  All other review of systems negative except as mentioned in the HPI.  Physical Exam: Vital signs in last 24 hours: Blood Pressure 132/87   Pulse 71   Temperature 98 F (36.7 C) (Temporal)   Respiration 17   Height 5\' 8"  (1.727 m)   Weight 175 lb (79.4 kg)  Oxygen Saturation 100%   Body Mass Index 26.61 kg/m  General:   Alert, NAD Lungs:  Clear .   Heart:  Regular rate and rhythm Abdomen:  Soft, nontender and nondistended. Neuro/Psych:  Alert and cooperative. Normal mood and affect. A and O x 3  Reviewed labs, radiology imaging, old records and pertinent past GI work up  Patient is appropriate for planned procedure(s) and anesthesia in an ambulatory setting   K. Scherry Ran , MD [phone]

## 2022-10-20 NOTE — Op Note (Signed)
[facility] Patient Name: Jon Cruz Procedure Date: 10/20/2022 8:30 AM MRN: [id number] Endoscopist: Napoleon Form , MD, [id number] Age: 52 Referring MD:  Date of Birth: 1970/11/25 Gender: Male Account #: [id number] Procedure:                Colonoscopy Indications:              Screening in patient at increased risk: Family                            history of 1st-degree relative with colorectal                            cancer Medicines:                Monitored Anesthesia Care Procedure:                Pre-Anesthesia Assessment:                           - Prior to the procedure, a History and Physical                            was performed, and patient medications and                            allergies were reviewed. The patient's tolerance of                            previous anesthesia was also reviewed. The risks                            and benefits of the procedure and the sedation                            options and risks were discussed with the patient.                            All questions were answered, and informed consent                            was obtained. Prior Anticoagulants: The patient has                            taken no anticoagulant or antiplatelet agents. ASA                            Grade Assessment: II - A patient with mild systemic                            disease. After reviewing the risks and benefits,                            the patient was deemed in satisfactory condition to  undergo the procedure.                           After obtaining informed consent, the colonoscope                            was passed under direct vision. Throughout the                            procedure, the patient's blood pressure, pulse, and                            oxygen saturations were monitored continuously. The                            Olympus PCF-H190DL (#[id number]) Colonoscope was                             introduced through the anus and advanced to the the                            cecum, identified by appendiceal orifice and                            ileocecal valve. The colonoscopy was performed                            without difficulty. The patient tolerated the                            procedure well. The quality of the bowel                            preparation was good. The ileocecal valve,                            appendiceal orifice, and rectum were photographed. Scope In: 8:44:39 AM Scope Out: 8:57:34 AM Scope Withdrawal Time: 0 hours 9 minutes 40 seconds  Total Procedure Duration: 0 hours 12 minutes 55 seconds  Findings:                 The perianal and digital rectal examinations were                            normal.                           Scattered small-mouthed diverticula were found in                            the sigmoid colon, descending colon, transverse                            colon and ascending colon.  Non-bleeding internal hemorrhoids were found during                            retroflexion. The hemorrhoids were medium-sized. Complications:            No immediate complications. Estimated Blood Loss:     Estimated blood loss was minimal. Impression:               - Diverticulosis in the sigmoid colon, in the                            descending colon, in the transverse colon and in                            the ascending colon.                           - Non-bleeding internal hemorrhoids.                           - No specimens collected. Recommendation:           - Patient has a contact number available for                            emergencies. The signs and symptoms of potential                            delayed complications were discussed with the                            patient. Return to normal activities tomorrow.                            Written discharge instructions were provided  to the                            patient.                           - Resume previous diet.                           - Continue present medications.                           - Repeat colonoscopy in 5 years for surveillance. Napoleon Form, MD 10/20/2022 9:17:11 AM This report has been signed electronically.

## 2022-10-21 ENCOUNTER — Telehealth: Payer: Self-pay | Admitting: *Deleted

## 2022-10-21 NOTE — Telephone Encounter (Signed)
Attempted to call patient for their post-procedure follow-up call. No answer. Left voicemail.   

## 2022-10-22 DIAGNOSIS — J3089 Other allergic rhinitis: Secondary | ICD-10-CM | POA: Diagnosis not present

## 2022-10-22 DIAGNOSIS — J301 Allergic rhinitis due to pollen: Secondary | ICD-10-CM | POA: Diagnosis not present

## 2022-10-22 DIAGNOSIS — J3081 Allergic rhinitis due to animal (cat) (dog) hair and dander: Secondary | ICD-10-CM | POA: Diagnosis not present

## 2022-11-04 DIAGNOSIS — J3089 Other allergic rhinitis: Secondary | ICD-10-CM | POA: Diagnosis not present

## 2022-11-04 DIAGNOSIS — J301 Allergic rhinitis due to pollen: Secondary | ICD-10-CM | POA: Diagnosis not present

## 2022-11-04 DIAGNOSIS — J3081 Allergic rhinitis due to animal (cat) (dog) hair and dander: Secondary | ICD-10-CM | POA: Diagnosis not present

## 2022-11-06 ENCOUNTER — Other Ambulatory Visit: Payer: Self-pay | Admitting: Family Medicine

## 2022-11-06 DIAGNOSIS — I700 Atherosclerosis of aorta: Secondary | ICD-10-CM

## 2022-11-10 DIAGNOSIS — J3089 Other allergic rhinitis: Secondary | ICD-10-CM | POA: Diagnosis not present

## 2022-11-10 DIAGNOSIS — J3081 Allergic rhinitis due to animal (cat) (dog) hair and dander: Secondary | ICD-10-CM | POA: Diagnosis not present

## 2022-11-10 DIAGNOSIS — J301 Allergic rhinitis due to pollen: Secondary | ICD-10-CM | POA: Diagnosis not present

## 2022-11-17 DIAGNOSIS — J301 Allergic rhinitis due to pollen: Secondary | ICD-10-CM | POA: Diagnosis not present

## 2022-11-17 DIAGNOSIS — J3089 Other allergic rhinitis: Secondary | ICD-10-CM | POA: Diagnosis not present

## 2022-12-02 DIAGNOSIS — J301 Allergic rhinitis due to pollen: Secondary | ICD-10-CM | POA: Diagnosis not present

## 2022-12-02 DIAGNOSIS — J3081 Allergic rhinitis due to animal (cat) (dog) hair and dander: Secondary | ICD-10-CM | POA: Diagnosis not present

## 2022-12-02 DIAGNOSIS — J3089 Other allergic rhinitis: Secondary | ICD-10-CM | POA: Diagnosis not present

## 2022-12-09 DIAGNOSIS — J3081 Allergic rhinitis due to animal (cat) (dog) hair and dander: Secondary | ICD-10-CM | POA: Diagnosis not present

## 2022-12-09 DIAGNOSIS — J301 Allergic rhinitis due to pollen: Secondary | ICD-10-CM | POA: Diagnosis not present

## 2022-12-09 DIAGNOSIS — J3089 Other allergic rhinitis: Secondary | ICD-10-CM | POA: Diagnosis not present

## 2022-12-15 ENCOUNTER — Encounter: Payer: BC Managed Care – PPO | Admitting: Family Medicine

## 2022-12-16 DIAGNOSIS — J301 Allergic rhinitis due to pollen: Secondary | ICD-10-CM | POA: Diagnosis not present

## 2022-12-16 DIAGNOSIS — J3089 Other allergic rhinitis: Secondary | ICD-10-CM | POA: Diagnosis not present

## 2022-12-16 DIAGNOSIS — J3081 Allergic rhinitis due to animal (cat) (dog) hair and dander: Secondary | ICD-10-CM | POA: Diagnosis not present

## 2022-12-21 DIAGNOSIS — H1045 Other chronic allergic conjunctivitis: Secondary | ICD-10-CM | POA: Diagnosis not present

## 2022-12-21 DIAGNOSIS — J301 Allergic rhinitis due to pollen: Secondary | ICD-10-CM | POA: Diagnosis not present

## 2022-12-21 DIAGNOSIS — J3081 Allergic rhinitis due to animal (cat) (dog) hair and dander: Secondary | ICD-10-CM | POA: Diagnosis not present

## 2022-12-21 DIAGNOSIS — J3089 Other allergic rhinitis: Secondary | ICD-10-CM | POA: Diagnosis not present

## 2022-12-29 DIAGNOSIS — Z85828 Personal history of other malignant neoplasm of skin: Secondary | ICD-10-CM | POA: Diagnosis not present

## 2022-12-29 DIAGNOSIS — D225 Melanocytic nevi of trunk: Secondary | ICD-10-CM | POA: Diagnosis not present

## 2022-12-29 DIAGNOSIS — L821 Other seborrheic keratosis: Secondary | ICD-10-CM | POA: Diagnosis not present

## 2022-12-29 DIAGNOSIS — L570 Actinic keratosis: Secondary | ICD-10-CM | POA: Diagnosis not present

## 2022-12-29 DIAGNOSIS — I788 Other diseases of capillaries: Secondary | ICD-10-CM | POA: Diagnosis not present

## 2022-12-31 DIAGNOSIS — J301 Allergic rhinitis due to pollen: Secondary | ICD-10-CM | POA: Diagnosis not present

## 2022-12-31 DIAGNOSIS — J3089 Other allergic rhinitis: Secondary | ICD-10-CM | POA: Diagnosis not present

## 2023-01-03 ENCOUNTER — Ambulatory Visit: Payer: BC Managed Care – PPO | Admitting: Family Medicine

## 2023-01-03 VITALS — BP 126/80 | HR 72 | Ht 67.75 in | Wt 184.4 lb

## 2023-01-03 DIAGNOSIS — K219 Gastro-esophageal reflux disease without esophagitis: Secondary | ICD-10-CM

## 2023-01-03 DIAGNOSIS — Z0000 Encounter for general adult medical examination without abnormal findings: Secondary | ICD-10-CM

## 2023-01-03 DIAGNOSIS — Z86010 Personal history of colonic polyps: Secondary | ICD-10-CM

## 2023-01-03 DIAGNOSIS — I700 Atherosclerosis of aorta: Secondary | ICD-10-CM

## 2023-01-03 DIAGNOSIS — J309 Allergic rhinitis, unspecified: Secondary | ICD-10-CM

## 2023-01-03 DIAGNOSIS — Z23 Encounter for immunization: Secondary | ICD-10-CM | POA: Diagnosis not present

## 2023-01-03 DIAGNOSIS — Z8619 Personal history of other infectious and parasitic diseases: Secondary | ICD-10-CM

## 2023-01-03 DIAGNOSIS — R748 Abnormal levels of other serum enzymes: Secondary | ICD-10-CM

## 2023-01-03 DIAGNOSIS — K5790 Diverticulosis of intestine, part unspecified, without perforation or abscess without bleeding: Secondary | ICD-10-CM

## 2023-01-03 DIAGNOSIS — C4441 Basal cell carcinoma of skin of scalp and neck: Secondary | ICD-10-CM

## 2023-01-03 DIAGNOSIS — Z800 Family history of malignant neoplasm of digestive organs: Secondary | ICD-10-CM

## 2023-01-03 DIAGNOSIS — Z8719 Personal history of other diseases of the digestive system: Secondary | ICD-10-CM

## 2023-01-03 MED ORDER — ROSUVASTATIN CALCIUM 10 MG PO TABS: 10.0000 mg | ORAL_TABLET | Freq: Every day | ORAL | 0 refills | Status: DC

## 2023-01-03 NOTE — Progress Notes (Unsigned)
Complete physical exam  Patient: Jon Cruz   DOB: Jul 13, 1970   52 y.o. Male  MRN: [id number]  Subjective:    Chief Complaint  Patient presents with   Annual Exam    Non fasting. Will need to come back for labs.     Jon Cruz is a 52 y.o. male who presents today for a complete physical exam. He reports consuming a general diet. Home exercise routine includes walking 0.5 hrs per day. He generally feels well. He reports sleeping fairly well. He does not have additional problems to discuss today.  He sees his allergist regularly and is getting weekly shots.  Reflux causes very little difficulty and he uses meds on an as-needed basis for that.  He continues on Crestor and having no troubles.  He gets regular colonoscopies.  He does have a previous history of diverticulitis and knows the symptoms to watch for in the future. He has had some skin cancers and gets checkups on that.  Most recent fall risk assessment:    01/03/2023    9:56 AM  Fall Risk   Falls in the past year? 0  Number falls in past yr: 0  Injury with Fall? 0  Follow up Falls evaluation completed     Most recent depression screenings:    01/03/2023    9:56 AM 08/12/2021    2:26 PM  PHQ 2/9 Scores  PHQ - 2 Score 0 0    Vision:Not within last year  and Dental: No current dental problems and Last dental visit: within last 6 months {History (Optional):23778}  Patient Care Team: Ronnald Nian, MD as PCP - General (Family Medicine)   Outpatient Medications Prior to Visit  Medication Sig Note   azelastine (ASTELIN) 0.1 % nasal spray Place 1 spray into both nostrils once. Use in each nostril as directed    levocetirizine (XYZAL) 5 MG tablet Take 5 mg by mouth every evening.    Multiple Vitamin (MULTIVITAMIN) capsule Take 1 capsule by mouth daily.    Probiotic Product (PROBIOTIC PO) Take 1 capsule by mouth daily at 6 (six) AM.    acetaminophen (TYLENOL) 500 MG tablet Take 500 mg by mouth every 6 (six) hours as  needed. (Patient not taking: Reported on 01/03/2023) 01/03/2023: Last dose last week   acyclovir cream (ZOVIRAX) 5 % Apply 1 Application topically every 4 (four) hours. (Patient not taking: Reported on 09/24/2022)    alum & mag hydroxide-simeth (GELUSIL) 200-200-25 MG chewable tablet Chew 1 tablet by mouth daily as needed for indigestion, heartburn or flatulence. (Patient not taking: Reported on 01/03/2023) 01/03/2023: Last dose last week   EPINEPHrine 0.3 mg/0.3 mL IJ SOAJ injection 1 injection (Patient not taking: Reported on 09/24/2022)    famotidine (PEPCID) 10 MG tablet Take 10 mg by mouth daily as needed for heartburn or indigestion. (Patient not taking: Reported on 01/03/2023) 01/03/2023: As needed. Last dose last week.   triamcinolone (NASACORT) 55 MCG/ACT AERO nasal inhaler Place 1 spray into the nose 2 (two) times daily. (Patient not taking: Reported on 01/03/2023) 01/03/2023: Last dose 3 days ago   valACYclovir (VALTREX) 1000 MG tablet Take 1 tablet (1,000 mg total) by mouth 2 (two) times daily. (Patient not taking: Reported on 01/03/2023) 01/03/2023: As needed   Wheat Dextrin (BENEFIBER PO) Take 1 Dose by mouth daily at 6 (six) AM. (Patient not taking: Reported on 01/03/2023) 01/03/2023: Last dose 3 days ago   [DISCONTINUED] rosuvastatin (CRESTOR) 10 MG tablet TAKE 1  TABLET BY MOUTH EVERY DAY    No facility-administered medications prior to visit.    Review of Systems  All other systems reviewed and are negative.  Otherwise family and social history as well as health maintenance and immunizations was reviewed       Objective:     {Vitals History (Optional):23777}  Physical Exam  Alert and in no distress. Tympanic membranes and canals are normal. Pharyngeal area is normal. Neck is supple without adenopathy or thyromegaly. Cardiac exam shows a regular sinus rhythm without murmurs or gallops. Lungs are clear to auscultation.  {Show previous labs (optional):23779}    Assessment & Plan:      Immunization History  Administered Date(s) Administered   Influenza Split 06/15/2016   Influenza, High Dose Seasonal PF 05/20/2020   Influenza-Unspecified 06/15/2016, 03/19/2021, 05/13/2022   Moderna Sars-Covid-2 Vaccination 06/20/2019, 07/18/2019, 09/13/2019, 05/20/2020   Tdap 01/30/2013, 11/09/2020   Zoster Recombinant(Shingrix) 09/11/2021    Health Maintenance  Topic Date Due   HIV Screening  Never done   Zoster Vaccines- Shingrix (2 of 2) 11/06/2021   COVID-19 Vaccine (5 - 2023-24 season) 01/08/2022   INFLUENZA VACCINE  12/09/2022   Colonoscopy  10/20/2027   DTaP/Tdap/Td (3 - Td or Tdap) 11/10/2030   Hepatitis C Screening  Completed   HPV VACCINES  Aged Out    Discussed health benefits of physical activity, and encouraged him to engage in regular exercise appropriate for his age and condition.  Problem List Items Addressed This Visit     Allergic rhinitis, mild   Aortic atherosclerosis (HCC)   Relevant Medications   rosuvastatin (CRESTOR) 10 MG tablet   Other Relevant Orders   Lipid panel   Basal cell carcinoma of scalp and skin of neck   Diverticulosis   Family hx of colon cancer   GERD (gastroesophageal reflux disease)   History of colonic polyps   History of diverticulitis   History of herpes labialis   Other Visit Diagnoses     Routine general medical examination at a health care facility    -  Primary   Relevant Orders   CBC with Differential/Platelet   Comprehensive metabolic panel   Lipid panel   Need for shingles vaccine       Relevant Orders   Varicella-zoster vaccine IM     Continue on present medication regimen.  Also discussed symptoms of diverticulitis and the need to potentially call if there is any questions concerning this. Recheck 1 year.    Sharlot Gowda, MD

## 2023-01-04 LAB — CBC WITH DIFFERENTIAL/PLATELET
Basophils Absolute: 0.1 10*3/uL (ref 0.0–0.2)
Basos: 1 %
EOS (ABSOLUTE): 0.5 10*3/uL — ABNORMAL HIGH (ref 0.0–0.4)
Eos: 5 %
Hematocrit: 45.3 % (ref 37.5–51.0)
Hemoglobin: 15.3 g/dL (ref 13.0–17.7)
Immature Grans (Abs): 0 10*3/uL (ref 0.0–0.1)
Immature Granulocytes: 1 %
Lymphocytes Absolute: 2.2 10*3/uL (ref 0.7–3.1)
Lymphs: 25 %
MCH: 29.8 pg (ref 26.6–33.0)
MCHC: 33.8 g/dL (ref 31.5–35.7)
MCV: 88 fL (ref 79–97)
Monocytes Absolute: 0.8 10*3/uL (ref 0.1–0.9)
Monocytes: 9 %
Neutrophils Absolute: 5.2 10*3/uL (ref 1.4–7.0)
Neutrophils: 59 %
Platelets: 287 10*3/uL (ref 150–450)
RBC: 5.14 x10E6/uL (ref 4.14–5.80)
RDW: 13 % (ref 11.6–15.4)
WBC: 8.7 10*3/uL (ref 3.4–10.8)

## 2023-01-04 LAB — COMPREHENSIVE METABOLIC PANEL
ALT: 60 IU/L — ABNORMAL HIGH (ref 0–44)
AST: 41 IU/L — ABNORMAL HIGH (ref 0–40)
Albumin: 4.4 g/dL (ref 3.8–4.9)
Alkaline Phosphatase: 113 IU/L (ref 44–121)
BUN/Creatinine Ratio: 14 (ref 9–20)
BUN: 14 mg/dL (ref 6–24)
Bilirubin Total: 0.3 mg/dL (ref 0.0–1.2)
CO2: 23 mmol/L (ref 20–29)
Calcium: 9.1 mg/dL (ref 8.7–10.2)
Chloride: 102 mmol/L (ref 96–106)
Creatinine, Ser: 1 mg/dL (ref 0.76–1.27)
Globulin, Total: 3.1 g/dL (ref 1.5–4.5)
Glucose: 90 mg/dL (ref 70–99)
Potassium: 4.2 mmol/L (ref 3.5–5.2)
Sodium: 138 mmol/L (ref 134–144)
Total Protein: 7.5 g/dL (ref 6.0–8.5)
eGFR: 91 mL/min/{1.73_m2} (ref >59)

## 2023-01-04 LAB — LIPID PANEL
Chol/HDL Ratio: 3.6 ratio (ref 0.0–5.0)
Cholesterol, Total: 154 mg/dL (ref 100–199)
HDL: 43 mg/dL (ref >39)
LDL Chol Calc (NIH): 74 mg/dL (ref 0–99)
Triglycerides: 221 mg/dL — ABNORMAL HIGH (ref 0–149)
VLDL Cholesterol Cal: 37 mg/dL (ref 5–40)

## 2023-01-04 NOTE — Addendum Note (Signed)
Addended by: Ronnald Nian on: 01/04/2023 04:34 PM   Modules accepted: Orders

## 2023-01-14 DIAGNOSIS — J301 Allergic rhinitis due to pollen: Secondary | ICD-10-CM | POA: Diagnosis not present

## 2023-01-14 DIAGNOSIS — J3089 Other allergic rhinitis: Secondary | ICD-10-CM | POA: Diagnosis not present

## 2023-01-19 DIAGNOSIS — J3089 Other allergic rhinitis: Secondary | ICD-10-CM | POA: Diagnosis not present

## 2023-01-19 DIAGNOSIS — J301 Allergic rhinitis due to pollen: Secondary | ICD-10-CM | POA: Diagnosis not present

## 2023-01-19 DIAGNOSIS — J3081 Allergic rhinitis due to animal (cat) (dog) hair and dander: Secondary | ICD-10-CM | POA: Diagnosis not present

## 2023-01-27 DIAGNOSIS — J3081 Allergic rhinitis due to animal (cat) (dog) hair and dander: Secondary | ICD-10-CM | POA: Diagnosis not present

## 2023-01-27 DIAGNOSIS — J3089 Other allergic rhinitis: Secondary | ICD-10-CM | POA: Diagnosis not present

## 2023-01-27 DIAGNOSIS — J301 Allergic rhinitis due to pollen: Secondary | ICD-10-CM | POA: Diagnosis not present

## 2023-02-03 DIAGNOSIS — J3081 Allergic rhinitis due to animal (cat) (dog) hair and dander: Secondary | ICD-10-CM | POA: Diagnosis not present

## 2023-02-03 DIAGNOSIS — J301 Allergic rhinitis due to pollen: Secondary | ICD-10-CM | POA: Diagnosis not present

## 2023-02-03 DIAGNOSIS — J3089 Other allergic rhinitis: Secondary | ICD-10-CM | POA: Diagnosis not present

## 2023-02-07 ENCOUNTER — Other Ambulatory Visit: Payer: BC Managed Care – PPO

## 2023-02-08 ENCOUNTER — Ambulatory Visit: Payer: BC Managed Care – PPO | Admitting: Medical

## 2023-02-08 ENCOUNTER — Encounter: Payer: Self-pay | Admitting: Medical

## 2023-02-08 VITALS — BP 130/80 | HR 79 | Temp 97.3°F | Ht 67.0 in | Wt 184.4 lb

## 2023-02-08 DIAGNOSIS — J3489 Other specified disorders of nose and nasal sinuses: Secondary | ICD-10-CM

## 2023-02-08 DIAGNOSIS — H9203 Otalgia, bilateral: Secondary | ICD-10-CM

## 2023-02-08 DIAGNOSIS — J302 Other seasonal allergic rhinitis: Secondary | ICD-10-CM | POA: Diagnosis not present

## 2023-02-08 LAB — POCT INFLUENZA A/B
Influenza A, POC: NEGATIVE
Influenza B, POC: NEGATIVE

## 2023-02-08 LAB — POC COVID19 BINAXNOW: SARS Coronavirus 2 Ag: NEGATIVE

## 2023-02-08 MED ORDER — AMOXICILLIN-POT CLAVULANATE 875-125 MG PO TABS: 1.0000 | ORAL_TABLET | Freq: Two times a day (BID) | ORAL | 0 refills | Status: DC

## 2023-02-08 NOTE — Progress Notes (Signed)
Subjective:  Jon Cruz is a 52 y.o. male who presents for Chief Complaint  Patient presents with   Nasal Congestion    Has had for a while. Left ear started hurting Friday. Yesterday started having some facial pain and pressure. No fever or chills. No covid tests. Daughter had strep last week, he does not really have a ST.      Here for possible sinus infection.   He notes hx/o allergies in fall, has had ongoing congestion. But recently having ear pain and pressure, ongoing head congestion, and yesterday more facial pressure, worried about sinus infection.   No recent covid test.  Daughter had strep last week.  No sore throat.   No fever, no body aches or chills.   No NVD.  No SOB or wheezing.     Using xyzal daily in general, tylenol yesterday, astelin nasal spray.   No other aggravating or relieving factors.    No other c/o.  Past Medical History:  Diagnosis Date   Abnormal findings on diagnostic imaging of liver 02/21/2017   Incidental finding on CT- multiple subcentimeter low attenuation lesions, likely cysts per radiologist   Aortic atherosclerosis (HCC)    on CT   Diverticulitis 2019   Elevated ALT measurement    GERD (gastroesophageal reflux disease)    takes otc meds   Hyperlipidemia    on meds   Seasonal allergies    Current Outpatient Medications on File Prior to Visit  Medication Sig Dispense Refill   levocetirizine (XYZAL) 5 MG tablet Take 5 mg by mouth every evening.     Multiple Vitamin (MULTIVITAMIN) capsule Take 1 capsule by mouth daily.     Probiotic Product (PROBIOTIC PO) Take 1 capsule by mouth daily at 6 (six) AM.     rosuvastatin (CRESTOR) 10 MG tablet Take 1 tablet (10 mg total) by mouth daily. 90 tablet 0   Wheat Dextrin (BENEFIBER PO) Take 1 Dose by mouth daily at 6 (six) AM.     acetaminophen (TYLENOL) 500 MG tablet Take 500 mg by mouth every 6 (six) hours as needed. (Patient not taking: Reported on 01/03/2023)     acyclovir cream (ZOVIRAX) 5 %  Apply 1 Application topically every 4 (four) hours. (Patient not taking: Reported on 09/24/2022) 15 g 0   alum & mag hydroxide-simeth (GELUSIL) 200-200-25 MG chewable tablet Chew 1 tablet by mouth daily as needed for indigestion, heartburn or flatulence. (Patient not taking: Reported on 02/08/2023)     azelastine (ASTELIN) 0.1 % nasal spray Place 1 spray into both nostrils once. Use in each nostril as directed (Patient not taking: Reported on 02/08/2023)     EPINEPHrine 0.3 mg/0.3 mL IJ SOAJ injection 1 injection (Patient not taking: Reported on 09/24/2022)     triamcinolone (NASACORT) 55 MCG/ACT AERO nasal inhaler Place 1 spray into the nose 2 (two) times daily. (Patient not taking: Reported on 01/03/2023) 1 Inhaler 12   valACYclovir (VALTREX) 1000 MG tablet Take 1 tablet (1,000 mg total) by mouth 2 (two) times daily. (Patient not taking: Reported on 01/03/2023) 40 tablet 1   No current facility-administered medications on file prior to visit.     The following portions of the patient's history were reviewed and updated as appropriate: allergies, current medications, past family history, past medical history, past social history, past surgical history and problem list.  ROS Otherwise as in subjective above  Objective: BP 130/80   Pulse 79   Temp (!) 97.3 F (36.3 C) (Tympanic)  Ht 5\' 7"  (1.702 m)   Wt 184 lb 6.4 oz (83.6 kg)   SpO2 97%   BMI 28.88 kg/m   General appearance: alert, no distress, well developed, well nourished HEENT: normocephalic, sclerae anicteric, conjunctiva pink and moist, TMs flat, nares with turbinate edema, mild erythema, clear  discharge , pharynx normal Oral cavity: MMM, no lesions Neck: supple, no lymphadenopathy, no thyromegaly, no masses Heart: RRR, normal S1, S2, no murmurs Lungs: CTA bilaterally, no wheezes, rhonchi, or rales Pulses: 2+ radial pulses, 2+ pedal pulses, normal cap refill Ext: no edema   Assessment: Encounter Diagnoses  Name Primary?    Sinus pressure Yes   Seasonal allergies    Otalgia of both ears      Plan: We discussed symptoms and concerns.  Continue allergy medicine as usual but add Sudafed or Mucinex twice a day for the next 3 to 5 days.  If no improvement at all within the next 48 to 72 hours then begin antibiotic below.  Sitter nasal saline.  Continue good water intake.  Call or recheck if not much improved within the next week  Anna was seen today for nasal congestion.  Diagnoses and all orders for this visit:  Sinus pressure -     POC COVID-19 -     Influenza A/B  Seasonal allergies  Otalgia of both ears  Other orders -     amoxicillin-clavulanate (AUGMENTIN) 875-125 MG tablet; Take 1 tablet by mouth 2 (two) times daily.    Follow up: prn

## 2023-02-15 ENCOUNTER — Other Ambulatory Visit: Payer: BC Managed Care – PPO

## 2023-02-15 DIAGNOSIS — R748 Abnormal levels of other serum enzymes: Secondary | ICD-10-CM | POA: Diagnosis not present

## 2023-02-16 LAB — COMPREHENSIVE METABOLIC PANEL
ALT: 58 IU/L — ABNORMAL HIGH (ref 0–44)
AST: 39 IU/L (ref 0–40)
Albumin: 4.4 g/dL (ref 3.8–4.9)
Alkaline Phosphatase: 111 IU/L (ref 44–121)
BUN/Creatinine Ratio: 12 (ref 9–20)
BUN: 13 mg/dL (ref 6–24)
Bilirubin Total: 0.3 mg/dL (ref 0.0–1.2)
CO2: 24 mmol/L (ref 20–29)
Calcium: 9.4 mg/dL (ref 8.7–10.2)
Chloride: 100 mmol/L (ref 96–106)
Creatinine, Ser: 1.08 mg/dL (ref 0.76–1.27)
Globulin, Total: 2.9 g/dL (ref 1.5–4.5)
Glucose: 93 mg/dL (ref 70–99)
Potassium: 4.6 mmol/L (ref 3.5–5.2)
Sodium: 139 mmol/L (ref 134–144)
Total Protein: 7.3 g/dL (ref 6.0–8.5)
eGFR: 83 mL/min/{1.73_m2} (ref >59)

## 2023-02-18 DIAGNOSIS — J3089 Other allergic rhinitis: Secondary | ICD-10-CM | POA: Diagnosis not present

## 2023-02-18 DIAGNOSIS — J301 Allergic rhinitis due to pollen: Secondary | ICD-10-CM | POA: Diagnosis not present

## 2023-02-18 DIAGNOSIS — J3081 Allergic rhinitis due to animal (cat) (dog) hair and dander: Secondary | ICD-10-CM | POA: Diagnosis not present

## 2023-02-24 DIAGNOSIS — J301 Allergic rhinitis due to pollen: Secondary | ICD-10-CM | POA: Diagnosis not present

## 2023-03-04 DIAGNOSIS — J3081 Allergic rhinitis due to animal (cat) (dog) hair and dander: Secondary | ICD-10-CM | POA: Diagnosis not present

## 2023-03-04 DIAGNOSIS — J3089 Other allergic rhinitis: Secondary | ICD-10-CM | POA: Diagnosis not present

## 2023-03-04 DIAGNOSIS — J301 Allergic rhinitis due to pollen: Secondary | ICD-10-CM | POA: Diagnosis not present

## 2023-03-16 DIAGNOSIS — J3081 Allergic rhinitis due to animal (cat) (dog) hair and dander: Secondary | ICD-10-CM | POA: Diagnosis not present

## 2023-03-16 DIAGNOSIS — J301 Allergic rhinitis due to pollen: Secondary | ICD-10-CM | POA: Diagnosis not present

## 2023-03-16 DIAGNOSIS — J3089 Other allergic rhinitis: Secondary | ICD-10-CM | POA: Diagnosis not present

## 2023-03-30 DIAGNOSIS — J3089 Other allergic rhinitis: Secondary | ICD-10-CM | POA: Diagnosis not present

## 2023-03-30 DIAGNOSIS — J3081 Allergic rhinitis due to animal (cat) (dog) hair and dander: Secondary | ICD-10-CM | POA: Diagnosis not present

## 2023-03-30 DIAGNOSIS — J301 Allergic rhinitis due to pollen: Secondary | ICD-10-CM | POA: Diagnosis not present

## 2023-04-05 DIAGNOSIS — J301 Allergic rhinitis due to pollen: Secondary | ICD-10-CM | POA: Diagnosis not present

## 2023-04-05 DIAGNOSIS — J3089 Other allergic rhinitis: Secondary | ICD-10-CM | POA: Diagnosis not present

## 2023-04-22 DIAGNOSIS — J3089 Other allergic rhinitis: Secondary | ICD-10-CM | POA: Diagnosis not present

## 2023-04-22 DIAGNOSIS — J301 Allergic rhinitis due to pollen: Secondary | ICD-10-CM | POA: Diagnosis not present

## 2023-04-22 DIAGNOSIS — J3081 Allergic rhinitis due to animal (cat) (dog) hair and dander: Secondary | ICD-10-CM | POA: Diagnosis not present

## 2023-05-16 DIAGNOSIS — J3081 Allergic rhinitis due to animal (cat) (dog) hair and dander: Secondary | ICD-10-CM | POA: Diagnosis not present

## 2023-05-16 DIAGNOSIS — J3089 Other allergic rhinitis: Secondary | ICD-10-CM | POA: Diagnosis not present

## 2023-05-16 DIAGNOSIS — J301 Allergic rhinitis due to pollen: Secondary | ICD-10-CM | POA: Diagnosis not present

## 2023-05-27 DIAGNOSIS — J3089 Other allergic rhinitis: Secondary | ICD-10-CM | POA: Diagnosis not present

## 2023-05-27 DIAGNOSIS — J3081 Allergic rhinitis due to animal (cat) (dog) hair and dander: Secondary | ICD-10-CM | POA: Diagnosis not present

## 2023-05-27 DIAGNOSIS — J301 Allergic rhinitis due to pollen: Secondary | ICD-10-CM | POA: Diagnosis not present

## 2023-06-02 DIAGNOSIS — J3089 Other allergic rhinitis: Secondary | ICD-10-CM | POA: Diagnosis not present

## 2023-06-02 DIAGNOSIS — J301 Allergic rhinitis due to pollen: Secondary | ICD-10-CM | POA: Diagnosis not present

## 2023-06-09 DIAGNOSIS — J3089 Other allergic rhinitis: Secondary | ICD-10-CM | POA: Diagnosis not present

## 2023-06-21 DIAGNOSIS — J301 Allergic rhinitis due to pollen: Secondary | ICD-10-CM | POA: Diagnosis not present

## 2023-06-21 DIAGNOSIS — J3081 Allergic rhinitis due to animal (cat) (dog) hair and dander: Secondary | ICD-10-CM | POA: Diagnosis not present

## 2023-06-21 DIAGNOSIS — J3089 Other allergic rhinitis: Secondary | ICD-10-CM | POA: Diagnosis not present

## 2023-06-24 ENCOUNTER — Other Ambulatory Visit: Payer: Self-pay | Admitting: Family Medicine

## 2023-06-24 DIAGNOSIS — I700 Atherosclerosis of aorta: Secondary | ICD-10-CM

## 2023-06-29 DIAGNOSIS — J301 Allergic rhinitis due to pollen: Secondary | ICD-10-CM | POA: Diagnosis not present

## 2023-06-29 DIAGNOSIS — J3089 Other allergic rhinitis: Secondary | ICD-10-CM | POA: Diagnosis not present

## 2023-06-29 DIAGNOSIS — J3081 Allergic rhinitis due to animal (cat) (dog) hair and dander: Secondary | ICD-10-CM | POA: Diagnosis not present

## 2023-07-05 ENCOUNTER — Encounter: Payer: Self-pay | Admitting: Internal Medicine

## 2023-07-15 DIAGNOSIS — J3081 Allergic rhinitis due to animal (cat) (dog) hair and dander: Secondary | ICD-10-CM | POA: Diagnosis not present

## 2023-07-15 DIAGNOSIS — J301 Allergic rhinitis due to pollen: Secondary | ICD-10-CM | POA: Diagnosis not present

## 2023-07-15 DIAGNOSIS — J3089 Other allergic rhinitis: Secondary | ICD-10-CM | POA: Diagnosis not present

## 2023-07-22 DIAGNOSIS — J3089 Other allergic rhinitis: Secondary | ICD-10-CM | POA: Diagnosis not present

## 2023-07-22 DIAGNOSIS — J301 Allergic rhinitis due to pollen: Secondary | ICD-10-CM | POA: Diagnosis not present

## 2023-07-22 DIAGNOSIS — J3081 Allergic rhinitis due to animal (cat) (dog) hair and dander: Secondary | ICD-10-CM | POA: Diagnosis not present

## 2023-07-29 DIAGNOSIS — J301 Allergic rhinitis due to pollen: Secondary | ICD-10-CM | POA: Diagnosis not present

## 2023-07-29 DIAGNOSIS — J3081 Allergic rhinitis due to animal (cat) (dog) hair and dander: Secondary | ICD-10-CM | POA: Diagnosis not present

## 2023-07-29 DIAGNOSIS — J3089 Other allergic rhinitis: Secondary | ICD-10-CM | POA: Diagnosis not present

## 2023-08-05 DIAGNOSIS — J301 Allergic rhinitis due to pollen: Secondary | ICD-10-CM | POA: Diagnosis not present

## 2023-08-05 DIAGNOSIS — J3081 Allergic rhinitis due to animal (cat) (dog) hair and dander: Secondary | ICD-10-CM | POA: Diagnosis not present

## 2023-08-05 DIAGNOSIS — J3089 Other allergic rhinitis: Secondary | ICD-10-CM | POA: Diagnosis not present

## 2023-08-11 DIAGNOSIS — J3089 Other allergic rhinitis: Secondary | ICD-10-CM | POA: Diagnosis not present

## 2023-08-11 DIAGNOSIS — J3081 Allergic rhinitis due to animal (cat) (dog) hair and dander: Secondary | ICD-10-CM | POA: Diagnosis not present

## 2023-08-11 DIAGNOSIS — J301 Allergic rhinitis due to pollen: Secondary | ICD-10-CM | POA: Diagnosis not present

## 2023-08-29 DIAGNOSIS — J301 Allergic rhinitis due to pollen: Secondary | ICD-10-CM | POA: Diagnosis not present

## 2023-08-29 DIAGNOSIS — J3089 Other allergic rhinitis: Secondary | ICD-10-CM | POA: Diagnosis not present

## 2023-08-29 DIAGNOSIS — J3081 Allergic rhinitis due to animal (cat) (dog) hair and dander: Secondary | ICD-10-CM | POA: Diagnosis not present

## 2023-09-12 DIAGNOSIS — J3089 Other allergic rhinitis: Secondary | ICD-10-CM | POA: Diagnosis not present

## 2023-09-12 DIAGNOSIS — J3081 Allergic rhinitis due to animal (cat) (dog) hair and dander: Secondary | ICD-10-CM | POA: Diagnosis not present

## 2023-09-12 DIAGNOSIS — J301 Allergic rhinitis due to pollen: Secondary | ICD-10-CM | POA: Diagnosis not present

## 2023-09-22 DIAGNOSIS — J3089 Other allergic rhinitis: Secondary | ICD-10-CM | POA: Diagnosis not present

## 2023-09-22 DIAGNOSIS — J301 Allergic rhinitis due to pollen: Secondary | ICD-10-CM | POA: Diagnosis not present

## 2023-09-22 DIAGNOSIS — J3081 Allergic rhinitis due to animal (cat) (dog) hair and dander: Secondary | ICD-10-CM | POA: Diagnosis not present

## 2023-09-28 DIAGNOSIS — J3081 Allergic rhinitis due to animal (cat) (dog) hair and dander: Secondary | ICD-10-CM | POA: Diagnosis not present

## 2023-09-28 DIAGNOSIS — J3089 Other allergic rhinitis: Secondary | ICD-10-CM | POA: Diagnosis not present

## 2023-09-28 DIAGNOSIS — J301 Allergic rhinitis due to pollen: Secondary | ICD-10-CM | POA: Diagnosis not present

## 2023-10-05 DIAGNOSIS — J3089 Other allergic rhinitis: Secondary | ICD-10-CM | POA: Diagnosis not present

## 2023-10-05 DIAGNOSIS — J301 Allergic rhinitis due to pollen: Secondary | ICD-10-CM | POA: Diagnosis not present

## 2023-10-05 DIAGNOSIS — J3081 Allergic rhinitis due to animal (cat) (dog) hair and dander: Secondary | ICD-10-CM | POA: Diagnosis not present

## 2023-10-11 DIAGNOSIS — J301 Allergic rhinitis due to pollen: Secondary | ICD-10-CM | POA: Diagnosis not present

## 2023-10-11 DIAGNOSIS — J3089 Other allergic rhinitis: Secondary | ICD-10-CM | POA: Diagnosis not present

## 2023-10-11 DIAGNOSIS — J3081 Allergic rhinitis due to animal (cat) (dog) hair and dander: Secondary | ICD-10-CM | POA: Diagnosis not present

## 2023-10-25 DIAGNOSIS — J3089 Other allergic rhinitis: Secondary | ICD-10-CM | POA: Diagnosis not present

## 2023-10-25 DIAGNOSIS — J301 Allergic rhinitis due to pollen: Secondary | ICD-10-CM | POA: Diagnosis not present

## 2023-10-25 DIAGNOSIS — J3081 Allergic rhinitis due to animal (cat) (dog) hair and dander: Secondary | ICD-10-CM | POA: Diagnosis not present

## 2023-11-09 DIAGNOSIS — J3089 Other allergic rhinitis: Secondary | ICD-10-CM | POA: Diagnosis not present

## 2023-11-09 DIAGNOSIS — J301 Allergic rhinitis due to pollen: Secondary | ICD-10-CM | POA: Diagnosis not present

## 2023-11-09 DIAGNOSIS — J3081 Allergic rhinitis due to animal (cat) (dog) hair and dander: Secondary | ICD-10-CM | POA: Diagnosis not present

## 2023-11-18 DIAGNOSIS — J3089 Other allergic rhinitis: Secondary | ICD-10-CM | POA: Diagnosis not present

## 2023-11-18 DIAGNOSIS — J3081 Allergic rhinitis due to animal (cat) (dog) hair and dander: Secondary | ICD-10-CM | POA: Diagnosis not present

## 2023-11-18 DIAGNOSIS — J301 Allergic rhinitis due to pollen: Secondary | ICD-10-CM | POA: Diagnosis not present

## 2023-12-01 DIAGNOSIS — J3089 Other allergic rhinitis: Secondary | ICD-10-CM | POA: Diagnosis not present

## 2023-12-01 DIAGNOSIS — J301 Allergic rhinitis due to pollen: Secondary | ICD-10-CM | POA: Diagnosis not present

## 2023-12-01 DIAGNOSIS — J3081 Allergic rhinitis due to animal (cat) (dog) hair and dander: Secondary | ICD-10-CM | POA: Diagnosis not present

## 2023-12-13 DIAGNOSIS — H1045 Other chronic allergic conjunctivitis: Secondary | ICD-10-CM | POA: Diagnosis not present

## 2023-12-13 DIAGNOSIS — J3081 Allergic rhinitis due to animal (cat) (dog) hair and dander: Secondary | ICD-10-CM | POA: Diagnosis not present

## 2023-12-13 DIAGNOSIS — J301 Allergic rhinitis due to pollen: Secondary | ICD-10-CM | POA: Diagnosis not present

## 2023-12-13 DIAGNOSIS — J3089 Other allergic rhinitis: Secondary | ICD-10-CM | POA: Diagnosis not present

## 2023-12-21 DIAGNOSIS — J301 Allergic rhinitis due to pollen: Secondary | ICD-10-CM | POA: Diagnosis not present

## 2023-12-21 DIAGNOSIS — J3089 Other allergic rhinitis: Secondary | ICD-10-CM | POA: Diagnosis not present

## 2023-12-21 DIAGNOSIS — J3081 Allergic rhinitis due to animal (cat) (dog) hair and dander: Secondary | ICD-10-CM | POA: Diagnosis not present

## 2023-12-23 ENCOUNTER — Other Ambulatory Visit: Payer: Self-pay | Admitting: Family Medicine

## 2023-12-23 DIAGNOSIS — I700 Atherosclerosis of aorta: Secondary | ICD-10-CM

## 2023-12-26 DIAGNOSIS — J3081 Allergic rhinitis due to animal (cat) (dog) hair and dander: Secondary | ICD-10-CM | POA: Diagnosis not present

## 2023-12-26 DIAGNOSIS — J301 Allergic rhinitis due to pollen: Secondary | ICD-10-CM | POA: Diagnosis not present

## 2023-12-26 DIAGNOSIS — J3089 Other allergic rhinitis: Secondary | ICD-10-CM | POA: Diagnosis not present

## 2024-01-11 DIAGNOSIS — J301 Allergic rhinitis due to pollen: Secondary | ICD-10-CM | POA: Diagnosis not present

## 2024-01-11 DIAGNOSIS — J3089 Other allergic rhinitis: Secondary | ICD-10-CM | POA: Diagnosis not present

## 2024-01-25 DIAGNOSIS — J3089 Other allergic rhinitis: Secondary | ICD-10-CM | POA: Diagnosis not present

## 2024-01-25 DIAGNOSIS — J3081 Allergic rhinitis due to animal (cat) (dog) hair and dander: Secondary | ICD-10-CM | POA: Diagnosis not present

## 2024-01-25 DIAGNOSIS — J301 Allergic rhinitis due to pollen: Secondary | ICD-10-CM | POA: Diagnosis not present

## 2024-01-31 ENCOUNTER — Encounter: Payer: BC Managed Care – PPO | Admitting: Family Medicine

## 2024-02-02 DIAGNOSIS — D2271 Melanocytic nevi of right lower limb, including hip: Secondary | ICD-10-CM | POA: Diagnosis not present

## 2024-02-02 DIAGNOSIS — Z85828 Personal history of other malignant neoplasm of skin: Secondary | ICD-10-CM | POA: Diagnosis not present

## 2024-02-02 DIAGNOSIS — L814 Other melanin hyperpigmentation: Secondary | ICD-10-CM | POA: Diagnosis not present

## 2024-02-02 DIAGNOSIS — L821 Other seborrheic keratosis: Secondary | ICD-10-CM | POA: Diagnosis not present

## 2024-02-08 DIAGNOSIS — J301 Allergic rhinitis due to pollen: Secondary | ICD-10-CM | POA: Diagnosis not present

## 2024-02-08 DIAGNOSIS — J3089 Other allergic rhinitis: Secondary | ICD-10-CM | POA: Diagnosis not present

## 2024-02-08 DIAGNOSIS — J3081 Allergic rhinitis due to animal (cat) (dog) hair and dander: Secondary | ICD-10-CM | POA: Diagnosis not present

## 2024-02-17 ENCOUNTER — Encounter: Payer: Self-pay | Admitting: Family Medicine

## 2024-02-17 ENCOUNTER — Ambulatory Visit: Admitting: Family Medicine

## 2024-02-17 VITALS — BP 118/78 | HR 78 | Ht 67.0 in | Wt 184.6 lb

## 2024-02-17 DIAGNOSIS — T7840XS Allergy, unspecified, sequela: Secondary | ICD-10-CM | POA: Diagnosis not present

## 2024-02-17 DIAGNOSIS — J309 Allergic rhinitis, unspecified: Secondary | ICD-10-CM | POA: Diagnosis not present

## 2024-02-17 DIAGNOSIS — R7401 Elevation of levels of liver transaminase levels: Secondary | ICD-10-CM

## 2024-02-17 DIAGNOSIS — Z0000 Encounter for general adult medical examination without abnormal findings: Secondary | ICD-10-CM

## 2024-02-17 DIAGNOSIS — Z800 Family history of malignant neoplasm of digestive organs: Secondary | ICD-10-CM | POA: Diagnosis not present

## 2024-02-17 DIAGNOSIS — Z23 Encounter for immunization: Secondary | ICD-10-CM

## 2024-02-17 DIAGNOSIS — Z136 Encounter for screening for cardiovascular disorders: Secondary | ICD-10-CM

## 2024-02-17 MED ORDER — LEVOCETIRIZINE DIHYDROCHLORIDE 5 MG PO TABS: 5.0000 mg | ORAL_TABLET | Freq: Every evening | ORAL | 0 refills | Status: AC

## 2024-02-17 MED ORDER — AZELASTINE HCL 0.1 % NA SOLN: 1.0000 | Freq: Once | NASAL | 1 refills | Status: DC

## 2024-02-17 NOTE — Progress Notes (Signed)
 Name: Jon Cruz   Date of Visit: 02/17/24   Date of last visit with me: Visit date not found   CHIEF COMPLAINT:  Chief Complaint  Patient presents with   Annual Exam    Cpe. Fasting.        HPI:  Discussed the use of AI scribe software for clinical note transcription with the patient, who gave verbal consent to proceed.  History of Present Illness   Jon Cruz is a 53 year old male who presents for an annual physical exam.  He is currently taking rosuvastatin  and initially experienced mild muscle soreness, which has since resolved. He is not experiencing significant muscle aches at present.  He receives allergy shots for environmental and seasonal allergies, including dust, mold, grass, and trees, with symptoms worsening during certain seasons.  He has a history of elevated liver enzymes, which were previously high but decreased upon retesting in October of the previous year.  He has not received a COVID vaccine recently. He has not had COVID recently.  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 He is originally from near [place], [place] , and his family resides on a farm in [place].         OBJECTIVE:       02/17/2024    8:09 AM  Depression screen PHQ 2/9  Decreased Interest 0  Down, Depressed, Hopeless 0  PHQ - 2 Score 0     BP Readings from Last 3 Encounters:  02/17/24 118/78  02/08/23 130/80  01/03/23 126/80    BP 118/78   Pulse 78   Ht 5' 7 (1.702 m)   Wt 184 lb 9.6 oz (83.7 kg)   SpO2 99%   BMI 28.91 kg/m    Physical Exam          Physical Exam Constitutional:      Appearance: Normal appearance.  Neurological:     General: No focal deficit present.     Mental Status: He is alert and oriented to person, place, and time. Mental status is at baseline.     ASSESSMENT/PLAN:   Assessment & Plan Annual physical exam  Allergy, sequela  Elevated transaminase  measurement  Encounter for screening for cardiovascular disorders  Family hx of colon cancer    Assessment and Plan    Hyperlipidemia-stable Managed with rosuvastatin . Initial muscle soreness resolved. Statins effective in significantly lowering cholesterol. - Continue rosuvastatin  at current dose. - Order basic labs including liver enzymes and lipoprotein levels.  Allergic rhinitis-stable Managed with allergy shots and Xyzal. Allergies to dust, mold, grass, and trees with seasonal symptom exacerbation. - Continue allergy shots. - Continue over-the-counter Xyzal. - Send prescription for Xyzal to [facility] on [address].  Elevated ALT - Repeat CMP to evaluate downtrend  General Health Maintenance Up to date on screenings. COVID and pneumococcal vaccines discussed as optional. One-time HIV screening recommended per state guidelines. - Order one-time HIV screening. - Discuss optional COVID and pneumococcal vaccines.  Declined for now - Up to date on screenings.  - Comprehensive annual physical exam completed today. Reviewed interval history, current medical issues, medications, allergies, and preventive care needs. Addressed all patient questions and concerns. Discussed lifestyle factors including diet, exercise, sleep, and stress management. Reviewed recommended age-appropriate screenings, labs, and vaccinations. Counseling provided on healthy habits and routine health maintenance. Follow-up as indicated based on findings and results.         Arhianna Ebey A. Vita MD  [facility]

## 2024-02-18 LAB — CBC WITH DIFFERENTIAL/PLATELET
Basophils Absolute: 0.1 x10E3/uL (ref 0.0–0.2)
Basos: 1 %
EOS (ABSOLUTE): 0.5 x10E3/uL — ABNORMAL HIGH (ref 0.0–0.4)
Eos: 6 %
Hematocrit: 45.3 % (ref 37.5–51.0)
Hemoglobin: 15.2 g/dL (ref 13.0–17.7)
Immature Grans (Abs): 0 x10E3/uL (ref 0.0–0.1)
Immature Granulocytes: 0 %
Lymphocytes Absolute: 1.9 x10E3/uL (ref 0.7–3.1)
Lymphs: 21 %
MCH: 30.4 pg (ref 26.6–33.0)
MCHC: 33.6 g/dL (ref 31.5–35.7)
MCV: 91 fL (ref 79–97)
Monocytes Absolute: 0.8 x10E3/uL (ref 0.1–0.9)
Monocytes: 8 %
Neutrophils Absolute: 5.9 x10E3/uL (ref 1.4–7.0)
Neutrophils: 64 %
Platelets: 280 x10E3/uL (ref 150–450)
RBC: 5 x10E6/uL (ref 4.14–5.80)
RDW: 13.2 % (ref 11.6–15.4)
WBC: 9.2 x10E3/uL (ref 3.4–10.8)

## 2024-02-18 LAB — COMPREHENSIVE METABOLIC PANEL WITH GFR
ALT: 53 IU/L — ABNORMAL HIGH (ref 0–44)
AST: 32 IU/L (ref 0–40)
Albumin: 4.3 g/dL (ref 3.8–4.9)
Alkaline Phosphatase: 109 IU/L (ref 47–123)
BUN/Creatinine Ratio: 12 (ref 9–20)
BUN: 13 mg/dL (ref 6–24)
Bilirubin Total: 0.3 mg/dL (ref 0.0–1.2)
CO2: 20 mmol/L (ref 20–29)
Calcium: 9 mg/dL (ref 8.7–10.2)
Chloride: 103 mmol/L (ref 96–106)
Creatinine, Ser: 1.12 mg/dL (ref 0.76–1.27)
Globulin, Total: 2.8 g/dL (ref 1.5–4.5)
Glucose: 98 mg/dL (ref 70–99)
Potassium: 4.1 mmol/L (ref 3.5–5.2)
Sodium: 137 mmol/L (ref 134–144)
Total Protein: 7.1 g/dL (ref 6.0–8.5)
eGFR: 79 mL/min/1.73 (ref >59)

## 2024-02-18 LAB — LIPID PANEL
Chol/HDL Ratio: 4.2 ratio (ref 0.0–5.0)
Cholesterol, Total: 174 mg/dL (ref 100–199)
HDL: 41 mg/dL (ref >39)
LDL Chol Calc (NIH): 98 mg/dL (ref 0–99)
Triglycerides: 204 mg/dL — ABNORMAL HIGH (ref 0–149)
VLDL Cholesterol Cal: 35 mg/dL (ref 5–40)

## 2024-02-18 LAB — HIV ANTIBODY (ROUTINE TESTING W REFLEX): HIV Screen 4th Generation wRfx: NONREACTIVE

## 2024-02-21 DIAGNOSIS — J3089 Other allergic rhinitis: Secondary | ICD-10-CM | POA: Diagnosis not present

## 2024-02-21 DIAGNOSIS — J301 Allergic rhinitis due to pollen: Secondary | ICD-10-CM | POA: Diagnosis not present

## 2024-02-27 ENCOUNTER — Ambulatory Visit: Payer: Self-pay | Admitting: Family Medicine

## 2024-02-28 DIAGNOSIS — J301 Allergic rhinitis due to pollen: Secondary | ICD-10-CM | POA: Diagnosis not present

## 2024-02-28 DIAGNOSIS — J3089 Other allergic rhinitis: Secondary | ICD-10-CM | POA: Diagnosis not present

## 2024-02-28 DIAGNOSIS — J3081 Allergic rhinitis due to animal (cat) (dog) hair and dander: Secondary | ICD-10-CM | POA: Diagnosis not present

## 2024-03-06 DIAGNOSIS — J3081 Allergic rhinitis due to animal (cat) (dog) hair and dander: Secondary | ICD-10-CM | POA: Diagnosis not present

## 2024-03-06 DIAGNOSIS — J301 Allergic rhinitis due to pollen: Secondary | ICD-10-CM | POA: Diagnosis not present

## 2024-03-06 DIAGNOSIS — J3089 Other allergic rhinitis: Secondary | ICD-10-CM | POA: Diagnosis not present

## 2024-03-19 DIAGNOSIS — J3081 Allergic rhinitis due to animal (cat) (dog) hair and dander: Secondary | ICD-10-CM | POA: Diagnosis not present

## 2024-03-19 DIAGNOSIS — J3089 Other allergic rhinitis: Secondary | ICD-10-CM | POA: Diagnosis not present

## 2024-03-19 DIAGNOSIS — J301 Allergic rhinitis due to pollen: Secondary | ICD-10-CM | POA: Diagnosis not present

## 2024-03-23 DIAGNOSIS — Z8719 Personal history of other diseases of the digestive system: Secondary | ICD-10-CM | POA: Diagnosis not present

## 2024-03-23 DIAGNOSIS — R1030 Lower abdominal pain, unspecified: Secondary | ICD-10-CM | POA: Diagnosis not present

## 2024-03-29 DIAGNOSIS — J3089 Other allergic rhinitis: Secondary | ICD-10-CM | POA: Diagnosis not present

## 2024-03-29 DIAGNOSIS — J301 Allergic rhinitis due to pollen: Secondary | ICD-10-CM | POA: Diagnosis not present

## 2024-03-29 DIAGNOSIS — J3081 Allergic rhinitis due to animal (cat) (dog) hair and dander: Secondary | ICD-10-CM | POA: Diagnosis not present

## 2024-04-03 ENCOUNTER — Ambulatory Visit: Admitting: Family Medicine

## 2024-04-03 VITALS — BP 124/82 | HR 76 | Wt 181.6 lb

## 2024-04-03 DIAGNOSIS — R059 Cough, unspecified: Secondary | ICD-10-CM | POA: Diagnosis not present

## 2024-04-03 DIAGNOSIS — J069 Acute upper respiratory infection, unspecified: Secondary | ICD-10-CM | POA: Diagnosis not present

## 2024-04-03 DIAGNOSIS — R0982 Postnasal drip: Secondary | ICD-10-CM | POA: Diagnosis not present

## 2024-04-03 LAB — POC COVID19 BINAXNOW: SARS Coronavirus 2 Ag: NEGATIVE

## 2024-04-03 LAB — POCT INFLUENZA A/B
Influenza A, POC: NEGATIVE
Influenza B, POC: NEGATIVE

## 2024-04-03 NOTE — Progress Notes (Signed)
 Name: Jon Cruz   Date of Visit: 04/03/24   Date of last visit with me: 02/17/2024   CHIEF COMPLAINT:  Chief Complaint  Patient presents with   other    Flare up of diverticulitis, started about 1 week and half ago, gave him amoxiclillin clav, and finished that up from urgent care, cold getting worse, started Friday more drainage and sore throat, Saturday and Sunday more congestion and cough, voice is cracking now, taking dayquil and Mucinex, yesterday had loose stool and gas, but better today,        HPI:  Discussed the use of AI scribe software for clinical note transcription with the patient, who gave verbal consent to proceed.  History of Present Illness   Jon Cruz is a 53 year old male who presents with upper respiratory symptoms.  He has been experiencing upper respiratory symptoms that began with drainage and congestion, which he attributes to his allergies. He has been using azelastine nasal spray for allergic rhinitis. The symptoms progressed to include a sore throat, which has since improved, and a cough with some expectoration. He describes the cough as originating from a higher location in the throat.  He recently completed a course of antibiotics for a diverticulitis flare, with the last dose taken on Saturday morning. Since then, he has resumed a normal diet but experienced loose stools on Monday, without blood or significant pain. He notes a sensation of tightness, akin to gas, but not pain.  He is currently taking Mucinex to help break up mucus secretions. No fever, significant pain, or blood in stools.         OBJECTIVE:       10 /02/2024    8:09 AM  Depression screen PHQ 2/9  Decreased Interest 0  Down, Depressed, Hopeless 0  PHQ - 2 Score 0     BP Readings from Last 3 Encounters:  04/03/24 124/82  02/17/24 118/78  02/08/23 130/80    BP 124/82   Pulse 76   Wt 181 lb 9.6 oz (82.4 kg)   BMI 28.44 kg/m    Physical Exam           Physical Exam Constitutional:      Appearance: Normal appearance.  HENT:     Nose: Congestion and rhinorrhea present.     Mouth/Throat:     Pharynx: Posterior oropharyngeal erythema present. No oropharyngeal exudate.  Neurological:     General: No focal deficit present.     Mental Status: He is alert and oriented to person, place, and time. Mental status is at baseline.     ASSESSMENT/PLAN:   Assessment & Plan Cough, unspecified type  Viral URI with cough  Post-nasal drip    Assessment and Plan    Acute upper respiratory infection with postnasal drip and cough Likely viral etiology due to recent antibiotic use and negative flu and COVID tests. Symptoms attributed to postnasal drip. - Recommended Flonase to decrease mucus production. - Advised use of humidifier for symptom relief. - Suggested Tylenol for symptomatic relief. - Continue Mucinex to break up mucus. - Encouraged hot showers and spicy foods to clear drainage.  Allergic rhinitis Chronic condition managed with azelastine  nasal spray. Symptoms consistent with allergic rhinitis. - Continue azelastine  nasal spray.  Recent diverticulitis flare, resolving Symptoms likely due to gut flora disruption from recent antibiotic use. Resolution indicated by absence of pain or blood. - Maintain high water intake. - Eat as tolerated, gradually returning to normal diet. -  Monitor for pain or blood.         Harden Bramer A. Vita MD [facility]

## 2024-04-17 ENCOUNTER — Encounter: Payer: Self-pay | Admitting: Family Medicine

## 2024-04-17 ENCOUNTER — Ambulatory Visit: Admitting: Family Medicine

## 2024-04-17 VITALS — BP 138/74 | HR 76 | Ht 67.0 in | Wt 183.0 lb

## 2024-04-17 DIAGNOSIS — J209 Acute bronchitis, unspecified: Secondary | ICD-10-CM | POA: Diagnosis not present

## 2024-04-17 DIAGNOSIS — Z8719 Personal history of other diseases of the digestive system: Secondary | ICD-10-CM

## 2024-04-17 MED ORDER — AMOXICILLIN 875 MG PO TABS: 875.0000 mg | ORAL_TABLET | Freq: Two times a day (BID) | ORAL | 0 refills | Status: AC

## 2024-04-17 NOTE — Progress Notes (Signed)
   Subjective:    Patient ID: Jon Cruz, male    DOB: [birth year], 53 y.o.   MRN: [id number]  Discussed the use of AI scribe software for clinical note transcription with the patient, who gave verbal consent to proceed.  History of Present Illness   Jon Cruz is a 53 year old male who presents with persistent sinus congestion and cough following a viral infection.  His symptoms began around November 21st with a cold. He was seen on November 25th and tested negative for COVID-19 and influenza. Despite this, his symptoms have not significantly improved.  He continues to experience sinus congestion and a cough, which he attributes to drainage. No fever is present. The congestion affects both his sinuses and chest, though primarily in the sinuses. He coughs up some material but is unsure if it is from the chest or sinuses.  He has a history of allergies to environmental factors such as grass, trees, and mold, but no known drug allergies.  Approximately a week before his initial visit on November 25th, he experienced a flare-up of diverticulitis, for which he was treated with amoxicillin -clavulanic acid (Augmentin ) after visiting an urgent care. He completed the course of antibiotics weeks ago and has had one previous flare-up of diverticulitis.  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           Review of Systems     Objective:    Physical Exam Physical Exam   CHEST: Lungs clear to auscultation bilaterally.            Assessment & Plan:  Assessment and Plan    Acute sinusitis and bronchitis Persistent sinus congestion and cough likely due to secondary bacterial infection post-viral infection. No fever or significant lower respiratory involvement. - Monitor symptoms. - Advised to return to normal health status; if not, leave a message on MyChart for further evaluation.  Diverticulitis of the colon Recent flare-up  treated with Augmentin . Emphasized early intervention to prevent complications. - Advised to contact provider if symptoms recur for prompt evaluation and management.

## 2024-04-23 DIAGNOSIS — J3081 Allergic rhinitis due to animal (cat) (dog) hair and dander: Secondary | ICD-10-CM | POA: Diagnosis not present

## 2024-04-23 DIAGNOSIS — J3089 Other allergic rhinitis: Secondary | ICD-10-CM | POA: Diagnosis not present

## 2024-04-23 DIAGNOSIS — J301 Allergic rhinitis due to pollen: Secondary | ICD-10-CM | POA: Diagnosis not present

## 2024-04-27 ENCOUNTER — Ambulatory Visit: Admitting: Medical

## 2024-04-27 ENCOUNTER — Ambulatory Visit: Admitting: Family Medicine

## 2024-04-27 VITALS — BP 122/84 | HR 101 | Temp 97.7°F | Wt 185.0 lb

## 2024-04-27 DIAGNOSIS — R0981 Nasal congestion: Secondary | ICD-10-CM

## 2024-04-27 DIAGNOSIS — J329 Chronic sinusitis, unspecified: Secondary | ICD-10-CM | POA: Diagnosis not present

## 2024-04-27 DIAGNOSIS — R052 Subacute cough: Secondary | ICD-10-CM

## 2024-04-27 DIAGNOSIS — R490 Dysphonia: Secondary | ICD-10-CM

## 2024-04-27 MED ORDER — PREDNISONE 20 MG PO TABS: ORAL_TABLET | ORAL | 0 refills | Status: AC

## 2024-04-27 MED ORDER — OSELTAMIVIR PHOSPHATE 75 MG PO CAPS: 75.0000 mg | ORAL_CAPSULE | Freq: Every day | ORAL | 0 refills | Status: AC

## 2024-04-27 MED ORDER — AZITHROMYCIN 500 MG PO TABS: 500.0000 mg | ORAL_TABLET | Freq: Every day | ORAL | 0 refills | Status: AC

## 2024-04-27 MED ORDER — PSEUDOEPHEDRINE HCL 60 MG PO TABS: 60.0000 mg | ORAL_TABLET | Freq: Two times a day (BID) | ORAL | 0 refills | Status: AC

## 2024-04-27 NOTE — Patient Instructions (Addendum)
 Recommendations: Continue good water intake throughout the day Add nasal saline flush once or twice daily for the next week Continue your routine medications Begin Sudafed 60mg  twice daily for 4-5 days I prescribed 3 day short burst of Prednisone  for inflammation in sinuses if not improving I prescribed Azithromycin  500mg  antibiotic to take once daily for 2-3 days if not improving Use salt water gargles to clear the throat of mucous  Consider hot tea daily If desired, you can add the once daily Tamiflu  since you have flu exposure

## 2024-04-27 NOTE — Progress Notes (Signed)
 "   Name: Jon Cruz   Date of Visit: 04/27/2024   Date of last visit with me: Visit date not found   CHIEF COMPLAINT:  Chief Complaint  Patient presents with   Follow-up    F/U for upper respiratory infection, still is having symptoms, was told to come back if did not fully recover, was RX amoxicillin  875 mg, has one pill left , is leaving town so wanted to also come in before doing so.        HPI:  Discussed the use of AI scribe software for clinical note transcription with the patient, who gave verbal consent to proceed.  History of Present Illness   Jon Cruz is a 53 year old male who presents for a follow-up of persistent upper respiratory symptoms.  He is following up after a recent flare-up of diverticulitis. Initially, he visited for this condition in November and subsequently developed symptoms of a cold.  He has persistent head congestion, drainage, and an occasional cough due to throat scratchiness. No fever throughout the illness. He was prescribed amoxicillin , which he has been taking for ten days, with the last dose scheduled for tonight. He notes some improvement but still experiences congestion and cough.  He occasionally used Mucinex at the onset of symptoms and has taken DayQuil over the past week, though he feels these medications have not been significantly effective. He continues to experience head congestion and drainage, with occasional brown mucus in the morning and clear or yellow mucus throughout the day.  No wheezing or shortness of breath. He has not required more than one round of antibiotics in the past and has never used steroids. He uses Flonase and takes Xyzal  daily for allergies, which he experiences year-round but not usually as severe as currently.  His daughter was recently diagnosed with the flu, but he has not experienced body aches or fever. He received a flu shot in October, while his daughter received hers a week and a half ago.  He  is in close proximity to her  No other aggravating or relieving factors. No other complaint.  Past Medical History:  Diagnosis Date   Abnormal findings on diagnostic imaging of liver 02/21/2017   Incidental finding on CT- multiple subcentimeter low attenuation lesions, likely cysts per radiologist   Aortic atherosclerosis    on CT   Diverticulitis 2019   Elevated ALT measurement    GERD (gastroesophageal reflux disease)    takes otc meds   Hyperlipidemia    on meds   Seasonal allergies    Medications Ordered Prior to Encounter[1]  Review of systems and subjective   Objective BP 122/84   Pulse (!) 101   Temp 97.7 F (36.5 C)   Wt 185 lb (83.9 kg)   SpO2 98%   BMI 28.98 kg/m   General appearence: alert, no distress, WD/WN,  HEENT: normocephalic, sclerae anicteric, TMs pearly, nares with some clear discharge, + erythema, pharynx with postnasal drainage and slight erythema Oral cavity: MMM, no lesions Neck: supple, no lymphadenopathy, no thyromegaly, no masses Heart: RRR, normal S1, S2, no murmurs Lungs: CTA bilaterally, no wheezes, rhonchi, or rales     Assessment Encounter Diagnoses  Name Primary?   Nasal congestion Yes   Purulent postnasal drainage    Subacute cough    Hoarseness     Plan Recommendations: Continue good water intake throughout the day Add nasal saline flush once or twice daily for the next week Continue your routine  medications Begin Sudafed 60mg  twice daily for 4-5 days I prescribed 3 day short burst of Prednisone  for inflammation in sinuses if not improving I prescribed Azithromycin  500mg  antibiotic to take once daily for 2-3 days if not improving Use salt water gargles to clear the throat of mucous  Consider hot tea daily If desired, you can add the once daily Tamiflu  for prophylaxis since you have flu exposure  Abdulraheem was seen today for follow-up.  Diagnoses and all orders for this visit:  Nasal congestion  Purulent postnasal  drainage  Subacute cough  Hoarseness  Other orders -     pseudoephedrine  (SUDAFED) 60 MG tablet; Take 1 tablet (60 mg total) by mouth in the morning and at bedtime. -     predniSONE  (DELTASONE ) 20 MG tablet; 3 tablets today, 2 tablets tomorrow, 1 tablet the third day -     azithromycin  (ZITHROMAX ) 500 MG tablet; Take 1 tablet (500 mg total) by mouth daily. -     oseltamivir  (TAMIFLU ) 75 MG capsule; Take 1 capsule (75 mg total) by mouth daily.   Follow-up as needed      [1]  Current Outpatient Medications on File Prior to Visit  Medication Sig Dispense Refill   acetaminophen (TYLENOL) 500 MG tablet Take 500 mg by mouth every 6 (six) hours as needed.     acyclovir  cream (ZOVIRAX ) 5 % Apply 1 Application topically every 4 (four) hours. 15 g 0   alum & mag hydroxide-simeth (GELUSIL) 200-200-25 MG chewable tablet Chew 1 tablet by mouth daily as needed for indigestion, heartburn or flatulence.     amoxicillin  (AMOXIL ) 875 MG tablet Take 1 tablet (875 mg total) by mouth 2 (two) times daily. 20 tablet 0   azelastine  (ASTELIN ) 0.1 % nasal spray Place 1 spray into both nostrils once for 1 dose. Use in each nostril as directed 30 mL 1   EPINEPHrine 0.3 mg/0.3 mL IJ SOAJ injection 1 injection     levocetirizine (XYZAL ) 5 MG tablet Take 1 tablet (5 mg total) by mouth every evening. 90 tablet 0   Multiple Vitamin (MULTIVITAMIN) capsule Take 1 capsule by mouth daily.     Probiotic Product (PROBIOTIC PO) Take 1 capsule by mouth daily at 6 (six) AM.     rosuvastatin  (CRESTOR ) 10 MG tablet TAKE 1 TABLET BY MOUTH EVERY DAY 90 tablet 1   triamcinolone  (NASACORT ) 55 MCG/ACT AERO nasal inhaler Place 1 spray into the nose 2 (two) times daily. 1 Inhaler 12   valACYclovir  (VALTREX ) 1000 MG tablet Take 1 tablet (1,000 mg total) by mouth 2 (two) times daily. 40 tablet 1   Wheat Dextrin (BENEFIBER PO) Take 1 Dose by mouth daily at 6 (six) AM.     No current facility-administered medications on file prior to  visit.   "

## 2024-05-06 ENCOUNTER — Other Ambulatory Visit: Payer: Self-pay | Admitting: Family Medicine

## 2024-05-06 DIAGNOSIS — T7840XS Allergy, unspecified, sequela: Secondary | ICD-10-CM

## 2025-02-21 ENCOUNTER — Encounter: Payer: Self-pay | Admitting: Family Medicine
